# Patient Record
Sex: Female | Born: 1935
Health system: Southern US, Community
[De-identification: ages and names within clinical notes are randomized; demographics above are authoritative.]

## PROBLEM LIST (undated history)

## (undated) DIAGNOSIS — I480 Paroxysmal atrial fibrillation: Secondary | ICD-10-CM

## (undated) DIAGNOSIS — F419 Anxiety disorder, unspecified: Secondary | ICD-10-CM

## (undated) DIAGNOSIS — I10 Essential (primary) hypertension: Secondary | ICD-10-CM

## (undated) DIAGNOSIS — R079 Chest pain, unspecified: Secondary | ICD-10-CM

## (undated) DIAGNOSIS — Z5189 Encounter for other specified aftercare: Secondary | ICD-10-CM

## (undated) DIAGNOSIS — E785 Hyperlipidemia, unspecified: Secondary | ICD-10-CM

## (undated) DIAGNOSIS — H269 Unspecified cataract: Secondary | ICD-10-CM

## (undated) HISTORY — DX: Unspecified cataract: H26.9

## (undated) HISTORY — PX: CHOLECYSTECTOMY: SHX55

## (undated) HISTORY — PX: OTHER SURGICAL HISTORY: SHX169

## (undated) HISTORY — PX: EYE SURGERY: SHX253

## (undated) HISTORY — DX: Paroxysmal atrial fibrillation: I48.0

## (undated) HISTORY — DX: Hyperlipidemia, unspecified: E78.5

## (undated) HISTORY — DX: Encounter for other specified aftercare: Z51.89

## (undated) HISTORY — DX: Chest pain, unspecified: R07.9

## (undated) HISTORY — DX: Essential (primary) hypertension: I10

## (undated) HISTORY — DX: Anxiety disorder, unspecified: F41.9

---

## 2016-12-12 ENCOUNTER — Ambulatory Visit (INDEPENDENT_AMBULATORY_CARE_PROVIDER_SITE_OTHER): Payer: Medicare HMO | Admitting: Unknown Physician Specialty

## 2016-12-12 ENCOUNTER — Encounter: Payer: Self-pay | Admitting: Unknown Physician Specialty

## 2016-12-12 VITALS — BP 142/83 | HR 103 | Temp 98.7°F | Ht 59.5 in | Wt 177.8 lb

## 2016-12-12 DIAGNOSIS — T50905A Adverse effect of unspecified drugs, medicaments and biological substances, initial encounter: Secondary | ICD-10-CM | POA: Insufficient documentation

## 2016-12-12 DIAGNOSIS — E7800 Pure hypercholesterolemia, unspecified: Secondary | ICD-10-CM | POA: Diagnosis not present

## 2016-12-12 DIAGNOSIS — R9431 Abnormal electrocardiogram [ECG] [EKG]: Secondary | ICD-10-CM

## 2016-12-12 DIAGNOSIS — F5101 Primary insomnia: Secondary | ICD-10-CM | POA: Diagnosis not present

## 2016-12-12 DIAGNOSIS — R69 Illness, unspecified: Secondary | ICD-10-CM | POA: Diagnosis not present

## 2016-12-12 DIAGNOSIS — R000 Tachycardia, unspecified: Secondary | ICD-10-CM

## 2016-12-12 DIAGNOSIS — I10 Essential (primary) hypertension: Secondary | ICD-10-CM | POA: Insufficient documentation

## 2016-12-12 DIAGNOSIS — T887XXA Unspecified adverse effect of drug or medicament, initial encounter: Secondary | ICD-10-CM | POA: Diagnosis not present

## 2016-12-12 DIAGNOSIS — G4700 Insomnia, unspecified: Secondary | ICD-10-CM | POA: Insufficient documentation

## 2016-12-12 DIAGNOSIS — R079 Chest pain, unspecified: Secondary | ICD-10-CM | POA: Diagnosis not present

## 2016-12-12 MED ORDER — VERAPAMIL HCL ER 180 MG PO TBCR: 180.0000 mg | EXTENDED_RELEASE_TABLET | Freq: Every day | ORAL | 2 refills | Status: DC

## 2016-12-12 MED ORDER — ATORVASTATIN CALCIUM 20 MG PO TABS: 20.0000 mg | ORAL_TABLET | Freq: Every day | ORAL | 3 refills | Status: DC

## 2016-12-12 NOTE — Assessment & Plan Note (Signed)
Allergic to the drug store generic of Diltiazam.  I will change to Verapamil.

## 2016-12-12 NOTE — Progress Notes (Signed)
BP (!) 142/83 (BP Location: Left Arm, Cuff Size: Large)   Pulse (!) 103   Temp 98.7 F (37.1 C)   Ht 4' 11.5" (1.511 m)   Wt 177 lb 12.8 oz (80.6 kg)   SpO2 94%   BMI 35.31 kg/m    Subjective:    Patient ID: Holly Lewis, female    DOB: 1932-09-13, 83 y.o.   MRN: [id number]  HPI: Holly Fordycelma Lewis is a 83 y.o. female  Chief Complaint  Patient presents with  . Establish Care     pt states that she is not currently taking any medications but that she has been on eliquis, dilatiazem, and a cholesterol medication in the past   Hypertension States he did have Diltiazem for her BP.   Average home BPs   No problems or lightheadedness Having chest pressure that comes and goes.   No Edema  Hyperlipidemia Had been on a cholesterol medication but ran out and not sure what it was.  Had to go through several before she got the right ones.   Reactions typically include rashes   Diet compliance: Off and on eats well Exercise: walks mainly  Insomnia Life long problem  Family History  Problem Relation Age of Onset  . Diabetes Mother   . Diabetes Sister   . Cancer Sister        breast  . Alzheimer's disease Sister   . Diabetes Brother   . Cancer Brother        blood   Social History   Social History  . Marital status: Widowed    Spouse name: N/A  . Number of children: N/A  . Years of education: N/A   Occupational History  . Not on file.   Social History Main Topics  . Smoking status: Never Smoker  . Smokeless tobacco: Never Used  . Alcohol use No  . Drug use: No  . Sexual activity: No   Other Topics Concern  . Not on file   Social History Narrative  . No narrative on file   Past Medical History:  Diagnosis Date  . Anxiety   . Blood transfusion without reported diagnosis   . Cataract   . Hyperlipidemia   . Hypertension    Past Surgical History:  Procedure Laterality Date  . CHOLECYSTECTOMY    . EYE SURGERY    . 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 kidney stones      Relevant past medical, surgical, family and social history reviewed and updated as indicated. Interim medical history since our last visit reviewed. Allergies and medications reviewed and updated.  Review of Systems  HENT: Negative.   Eyes: Negative.   Respiratory: Negative.   Cardiovascular:       Chest pressure comes and goes.  Not sure if with activity or not.  No history of stress tests  Endocrine: Negative.   Genitourinary: Negative.   Musculoskeletal: Negative.   Skin: Negative.   Psychiatric/Behavioral:       Life-long problem sleeping   Per HPI unless specifically indicated above    Objective:    BP (!) 142/83 (BP Location: Left Arm, Cuff Size: Large)   Pulse (!) 103   Temp 98.7 F (37.1 C)   Ht 4' 11.5" (1.511 m)   Wt 177 lb 12.8 oz (80.6 kg)   SpO2 94%   BMI 35.31 kg/m   Wt Readings from Last 3  Encounters:  12/12/16 177 lb 12.8 oz (80.6 kg)    Physical Exam  Constitutional: She is oriented to person, place, and time. She appears well-developed and well-nourished. No distress.  HENT:  Head: Normocephalic and atraumatic.  Eyes: Conjunctivae and lids are normal. Right eye exhibits no discharge. Left eye exhibits no discharge. No scleral icterus.  Neck: Normal range of motion. Neck supple. No JVD present. Carotid bruit is not present.  Cardiovascular: Normal rate, regular rhythm and normal heart sounds.   Pulmonary/Chest: Effort normal and breath sounds normal.  Abdominal: Soft. Normal appearance. She exhibits no distension. There is no splenomegaly or hepatomegaly. There is no rebound.  Musculoskeletal: Normal range of motion.  Neurological: She is alert and oriented to person, place, and time.  Skin: Skin is warm, dry and intact. No rash noted. No pallor.  Psychiatric: She has a normal mood and affect. Her behavior is normal. Judgment and thought content normal.   EKG was NSR with changes  consistent with prior inferior and anterior infarct.  No acute changes.    No results found for this or any previous visit.    Assessment & Plan:   Problem List Items Addressed This Visit      Unprioritized   Abnormal EKG   Relevant Orders   Ambulatory referral to Cardiology   Essential hypertension, benign    Allergic to the drug store generic of Diltiazam.  I will change to Verapamil.        Relevant Medications   verapamil (CALAN-SR) 180 MG CR tablet   atorvastatin (LIPITOR) 20 MG tablet   Other Relevant Orders   Comprehensive metabolic panel   Hypercholesteremia    Restart cholesterol medication due to history of old infarcts on EKG      Relevant Medications   verapamil (CALAN-SR) 180 MG CR tablet   atorvastatin (LIPITOR) 20 MG tablet   Other Relevant Orders   Lipid Panel w/o Chol/HDL Ratio   Insomnia    States it is life-long      Medication reaction    Reaction to multiple medications with hives.  Most recently reacted to the "orange" Diltiazam but not the "blue" she got at the previous pharmacy.  Will change to Verapamil.  Also rx for statin.  Asked to start meds at a different time.        Tachycardia    Improved on EKG.  Will Verapamil      Relevant Orders   Ambulatory referral to Cardiology   TSH    Other Visit Diagnoses    Chest pain, unspecified type    -  Primary   Relevant Orders   EKG 12-Lead (Completed)   Ambulatory referral to Cardiology       Follow up plan: Return in about 4 weeks (around 01/09/2017).

## 2016-12-12 NOTE — Assessment & Plan Note (Signed)
States it is life-long

## 2016-12-12 NOTE — Assessment & Plan Note (Signed)
Improved on EKG.  Will Verapamil

## 2016-12-12 NOTE — Assessment & Plan Note (Signed)
Reaction to multiple medications with hives.  Most recently reacted to the "orange" Diltiazam but not the "blue" she got at the previous pharmacy.  Will change to Verapamil.  Also rx for statin.  Asked to start meds at a different time.

## 2016-12-12 NOTE — Assessment & Plan Note (Addendum)
Restart cholesterol medication due to history of old infarcts on EKG

## 2016-12-13 ENCOUNTER — Encounter: Payer: Self-pay | Admitting: Unknown Physician Specialty

## 2016-12-13 LAB — COMPREHENSIVE METABOLIC PANEL
A/G RATIO: 1.4 (ref 1.2–2.2)
ALK PHOS: 81 IU/L (ref 39–117)
ALT: 13 IU/L (ref 0–32)
AST: 22 IU/L (ref 0–40)
Albumin: 4.3 g/dL (ref 3.5–4.7)
BUN/Creatinine Ratio: 22 (ref 12–28)
BUN: 19 mg/dL (ref 8–27)
CO2: 23 mmol/L (ref 20–29)
Calcium: 9.3 mg/dL (ref 8.7–10.3)
Chloride: 105 mmol/L (ref 96–106)
Creatinine, Ser: 0.87 mg/dL (ref 0.57–1.00)
GFR calc Af Amer: 72 mL/min/{1.73_m2} (ref >59)
GFR calc non Af Amer: 62 mL/min/{1.73_m2} (ref >59)
GLOBULIN, TOTAL: 3.1 g/dL (ref 1.5–4.5)
Glucose: 68 mg/dL (ref 65–99)
POTASSIUM: 4.6 mmol/L (ref 3.5–5.2)
SODIUM: 141 mmol/L (ref 134–144)
Total Protein: 7.4 g/dL (ref 6.0–8.5)

## 2016-12-13 LAB — LIPID PANEL W/O CHOL/HDL RATIO
CHOLESTEROL TOTAL: 238 mg/dL — AB (ref 100–199)
HDL: 55 mg/dL (ref >39)
LDL CALC: 156 mg/dL — AB (ref 0–99)
TRIGLYCERIDES: 134 mg/dL (ref 0–149)
VLDL Cholesterol Cal: 27 mg/dL (ref 5–40)

## 2016-12-13 LAB — TSH: TSH: 3.31 u[IU]/mL (ref 0.450–4.500)

## 2017-01-09 ENCOUNTER — Encounter: Payer: Self-pay | Admitting: Unknown Physician Specialty

## 2017-01-09 ENCOUNTER — Ambulatory Visit (INDEPENDENT_AMBULATORY_CARE_PROVIDER_SITE_OTHER): Payer: Medicare HMO | Admitting: Unknown Physician Specialty

## 2017-01-09 DIAGNOSIS — N3941 Urge incontinence: Secondary | ICD-10-CM

## 2017-01-09 DIAGNOSIS — E7800 Pure hypercholesterolemia, unspecified: Secondary | ICD-10-CM

## 2017-01-09 DIAGNOSIS — L509 Urticaria, unspecified: Secondary | ICD-10-CM

## 2017-01-09 DIAGNOSIS — F419 Anxiety disorder, unspecified: Secondary | ICD-10-CM

## 2017-01-09 DIAGNOSIS — Z8679 Personal history of other diseases of the circulatory system: Secondary | ICD-10-CM

## 2017-01-09 DIAGNOSIS — R69 Illness, unspecified: Secondary | ICD-10-CM | POA: Diagnosis not present

## 2017-01-09 DIAGNOSIS — L209 Atopic dermatitis, unspecified: Secondary | ICD-10-CM | POA: Insufficient documentation

## 2017-01-09 MED ORDER — SERTRALINE HCL 50 MG PO TABS: 50.0000 mg | ORAL_TABLET | Freq: Every day | ORAL | 3 refills | Status: DC

## 2017-01-09 MED ORDER — OXYBUTYNIN CHLORIDE ER 10 MG PO TB24: 10.0000 mg | ORAL_TABLET | Freq: Every day | ORAL | 2 refills | Status: DC

## 2017-01-09 NOTE — Assessment & Plan Note (Signed)
Related to anxiety

## 2017-01-09 NOTE — Assessment & Plan Note (Signed)
Chronic.  Will start Sertraline 50mg  daily

## 2017-01-09 NOTE — Assessment & Plan Note (Signed)
Rx for Ditropan XL.  Side effects discussed

## 2017-01-09 NOTE — Assessment & Plan Note (Signed)
Normal sinus rhythm here in office and therefore will not start Eliquis.  Appointment pending with cardiology

## 2017-01-09 NOTE — Assessment & Plan Note (Signed)
Tolerating Atorvastatin 

## 2017-01-09 NOTE — Progress Notes (Signed)
BP (!) 142/76   Pulse 70   Temp 97.8 F (36.6 C)   Wt 181 lb 3.2 oz (82.2 kg)   SpO2 97%   BMI 35.99 kg/m    Subjective:    Patient ID: Holly Lewis, female    DOB: 1932/10/09, 83 y.o.   MRN: [id number]  HPI: Holly Lewis is a 83 y.o. female  Chief Complaint  Patient presents with  . Hypertension    4 week f/up  . Urinary Incontinence    pt states she has been having trouble with incontinence for a while but it has recently gotten worse   . Rash    pt states she is still having rashes on her arms everyday, states she thought it was from her diltiazem but states the rashes are still coming up after stopping the medication   Pt with multiple reactions to multiple medications.  Last visit we added Atorvastatin and Cardizem and feel well.  However, continues to have intermittent rashes.  This starts on her arms and by the end of the day it is everywhere else.  She thinks much of it has to do with concerns about incontinence.    Previous records I got yesterday were reviewed.  Tolerated cholesterol medication was Fenofibrate but tolerating Atorvastatin at this time.  Anxiety, panic, and depression were noted.    Anxiety Reviewed old records and treated with anxiety in the past with Buspar and Xanax. She had been given Lexapro but it is not clear why stopped and not mentioned in f/u office notes.   Depression screen [facility] 2/9 12/12/2016  Decreased Interest 0  Down, Depressed, Hopeless 0  PHQ - 2 Score 0  Altered sleeping 3  Tired, decreased energy 3  Change in appetite 3  Feeling bad or failure about yourself  0  Trouble concentrating 0  Moving slowly or fidgety/restless 0  Suicidal thoughts 0  PHQ-9 Score 9    Incontinence States this has been going on for some time.  She had a surgical procedure in the past which did work.  However in 2008 she was told there were no more surgical options available.  States she often can't get to the bathroom fast enough but also has  leakage.  She drinks coffee in the AM.  She has tried timed voiding (but seems not eager to be very vigilant with this)  Hypertension Using medications without difficulty Average home BPs   No problems or lightheadedness No chest pain with exertion or shortness of breath No Edema   Hyperlipidemia Using medications without problems: No Muscle aches    Relevant past medical, surgical, family and social history reviewed and updated as indicated. Interim medical history since our last visit reviewed. Allergies and medications reviewed and updated.  Review of Systems  Per HPI unless specifically indicated above     Objective:    BP (!) 142/76   Pulse 70   Temp 97.8 F (36.6 C)   Wt 181 lb 3.2 oz (82.2 kg)   SpO2 97%   BMI 35.99 kg/m   Wt Readings from Last 3 Encounters:  01/09/17 181 lb 3.2 oz (82.2 kg)  12/12/16 177 lb 12.8 oz (80.6 kg)    Physical Exam  Constitutional: She is oriented to person, place, and time. She appears well-developed and well-nourished. No distress.  HENT:  Head: Normocephalic and atraumatic.  Eyes: Conjunctivae and lids are normal. Right eye exhibits no discharge. Left eye exhibits no discharge. No scleral icterus.  Neck:  Normal range of motion. Neck supple. No JVD present. Carotid bruit is not present.  Cardiovascular: Normal rate, regular rhythm and normal heart sounds.   Pulmonary/Chest: Effort normal and breath sounds normal.  Abdominal: Normal appearance. There is no splenomegaly or hepatomegaly.  Musculoskeletal: Normal range of motion.  Neurological: She is alert and oriented to person, place, and time.  Skin: Skin is warm, dry and intact. No rash noted. No pallor.  Psychiatric: She has a normal mood and affect. Her behavior is normal. Judgment and thought content normal.    Results for orders placed or performed in visit on 12/12/16  TSH  Result Value Ref Range   TSH 3.310 0.450 - 4.500 uIU/mL  Comprehensive metabolic panel  Result  Value Ref Range   Glucose 68 65 - 99 mg/dL   BUN 19 8 - 27 mg/dL   Creatinine, Ser 1.610.87 0.57 - 1.00 mg/dL   GFR calc non Af Amer 62 >59 mL/min/1.73   GFR calc Af Amer 72 >59 mL/min/1.73   BUN/Creatinine Ratio 22 12 - 28   Sodium 141 134 - 144 mmol/L   Potassium 4.6 3.5 - 5.2 mmol/L   Chloride 105 96 - 106 mmol/L   CO2 23 20 - 29 mmol/L   Calcium 9.3 8.7 - 10.3 mg/dL   Total Protein 7.4 6.0 - 8.5 g/dL   Albumin 4.3 3.5 - 4.7 g/dL   Globulin, Total 3.1 1.5 - 4.5 g/dL   Albumin/Globulin Ratio 1.4 1.2 - 2.2   Bilirubin Total <0.2 0.0 - 1.2 mg/dL   Alkaline Phosphatase 81 39 - 117 IU/L   AST 22 0 - 40 IU/L   ALT 13 0 - 32 IU/L  Lipid Panel w/o Chol/HDL Ratio  Result Value Ref Range   Cholesterol, Total 238 (H) 100 - 199 mg/dL   Triglycerides 096134 0 - 149 mg/dL   HDL 55 >04>39 mg/dL   VLDL Cholesterol Cal 27 5 - 40 mg/dL   LDL Calculated 540156 (H) 0 - 99 mg/dL      Assessment & Plan:   Problem List Items Addressed This Visit      Unprioritized   Anxiety    Chronic.  Will start Sertraline 50mg  daily      Relevant Medications   sertraline (ZOLOFT) 50 MG tablet   History of atrial fibrillation    Normal sinus rhythm here in office and therefore will not start Eliquis.  Appointment pending with cardiology         Hypercholesteremia    Tolerating Atorvastatin      Urge incontinence of urine    Rx for Ditropan XL.  Side effects discussed      Relevant Medications   oxybutynin (DITROPAN-XL) 10 MG 24 hr tablet   Urticaria    Related to anxiety          Follow up plan: Return in about 4 weeks (around 02/06/2017).

## 2017-02-09 ENCOUNTER — Ambulatory Visit (INDEPENDENT_AMBULATORY_CARE_PROVIDER_SITE_OTHER): Payer: Medicare HMO | Admitting: Unknown Physician Specialty

## 2017-02-09 ENCOUNTER — Encounter: Payer: Self-pay | Admitting: Unknown Physician Specialty

## 2017-02-09 DIAGNOSIS — I10 Essential (primary) hypertension: Secondary | ICD-10-CM

## 2017-02-09 DIAGNOSIS — N3941 Urge incontinence: Secondary | ICD-10-CM

## 2017-02-09 DIAGNOSIS — Z8679 Personal history of other diseases of the circulatory system: Secondary | ICD-10-CM

## 2017-02-09 DIAGNOSIS — F419 Anxiety disorder, unspecified: Secondary | ICD-10-CM | POA: Diagnosis not present

## 2017-02-09 DIAGNOSIS — R69 Illness, unspecified: Secondary | ICD-10-CM | POA: Diagnosis not present

## 2017-02-09 NOTE — Assessment & Plan Note (Signed)
Stable, continue present medications.   

## 2017-02-09 NOTE — Progress Notes (Signed)
BP 131/83 (BP Location: Left Arm, Cuff Size: Large)   Pulse 78   Temp 97.6 F (36.4 C)   Wt 179 lb (81.2 kg)   SpO2 94%   BMI 35.55 kg/m    Subjective:    Patient ID: Holly Lewis, female    DOB: 07/12/1932, 83 y.o.   MRN: [id number]  HPI: Holly Lewis is a 83 y.o. female  Chief Complaint  Patient presents with  . Anxiety    4 week f/up  . Bladder Incontinence    4 week f/up   Pt states she is doing much better.  She states she feels "fantastic" and a weight has been taking off her shoulders.  She attributes this due to urinary incontinence improved.    Rashes Improved.    Anxiety Doing much better with more energy and going all the time Depression screen [facility] 2/9 02/09/2017 12/12/2016  Decreased Interest 0 0  Down, Depressed, Hopeless 0 0  PHQ - 2 Score 0 0  Altered sleeping 2 3  Tired, decreased energy 0 3  Change in appetite 0 3  Feeling bad or failure about yourself  0 0  Trouble concentrating 0 0  Moving slowly or fidgety/restless 0 0  Suicidal thoughts 0 0  PHQ-9 Score 2 9     A-fib History of A-fib.  Appt pending with cardiology.  Stopped Eliquis due to no signs in office but probably needs a holter for further evaluation   Relevant past medical, surgical, family and social history reviewed and updated as indicated. Interim medical history since our last visit reviewed. Allergies and medications reviewed and updated.  Review of Systems  Per HPI unless specifically indicated above     Objective:    BP 131/83 (BP Location: Left Arm, Cuff Size: Large)   Pulse 78   Temp 97.6 F (36.4 C)   Wt 179 lb (81.2 kg)   SpO2 94%   BMI 35.55 kg/m   Wt Readings from Last 3 Encounters:  02/09/17 179 lb (81.2 kg)  01/09/17 181 lb 3.2 oz (82.2 kg)  12/12/16 177 lb 12.8 oz (80.6 kg)    Physical Exam  Constitutional: She is oriented to person, place, and time. She appears well-developed and well-nourished. No distress.  HENT:  Head: Normocephalic and  atraumatic.  Eyes: Conjunctivae and lids are normal. Right eye exhibits no discharge. Left eye exhibits no discharge. No scleral icterus.  Neck: Normal range of motion. Neck supple. No JVD present. Carotid bruit is not present.  Cardiovascular: Normal rate, regular rhythm and normal heart sounds.   Pulmonary/Chest: Effort normal and breath sounds normal.  Abdominal: Normal appearance. There is no splenomegaly or hepatomegaly.  Musculoskeletal: Normal range of motion.  Neurological: She is alert and oriented to person, place, and time.  Skin: Skin is warm, dry and intact. No rash noted. No pallor.  Psychiatric: She has a normal mood and affect. Her behavior is normal. Judgment and thought content normal.    Results for orders placed or performed in visit on 12/12/16  TSH  Result Value Ref Range   TSH 3.310 0.450 - 4.500 uIU/mL  Comprehensive metabolic panel  Result Value Ref Range   Glucose 68 65 - 99 mg/dL   BUN 19 8 - 27 mg/dL   Creatinine, Ser 4.09 0.57 - 1.00 mg/dL   GFR calc non Af Amer 62 >59 mL/min/1.73   GFR calc Af Amer 72 >59 mL/min/1.73   BUN/Creatinine Ratio 22 12 - 28  Sodium 141 134 - 144 mmol/L   Potassium 4.6 3.5 - 5.2 mmol/L   Chloride 105 96 - 106 mmol/L   CO2 23 20 - 29 mmol/L   Calcium 9.3 8.7 - 10.3 mg/dL   Total Protein 7.4 6.0 - 8.5 g/dL   Albumin 4.3 3.5 - 4.7 g/dL   Globulin, Total 3.1 1.5 - 4.5 g/dL   Albumin/Globulin Ratio 1.4 1.2 - 2.2   Bilirubin Total <0.2 0.0 - 1.2 mg/dL   Alkaline Phosphatase 81 39 - 117 IU/L   AST 22 0 - 40 IU/L   ALT 13 0 - 32 IU/L  Lipid Panel w/o Chol/HDL Ratio  Result Value Ref Range   Cholesterol, Total 238 (H) 100 - 199 mg/dL   Triglycerides 414 0 - 149 mg/dL   HDL 55 >23 mg/dL   VLDL Cholesterol Cal 27 5 - 40 mg/dL   LDL Calculated 953 (H) 0 - 99 mg/dL      Assessment & Plan:   Problem List Items Addressed This Visit      Unprioritized   Anxiety    Improved.  Continue Sertraline      Essential  hypertension, benign    Stable, continue present medications.        History of atrial fibrillation    Cardiology appt pending      Urge incontinence of urine    Much improved with Ditropan.  Continue present          Follow up plan: Return for schedule wellness and appt with me in 3 months.

## 2017-02-09 NOTE — Assessment & Plan Note (Signed)
Improved.  Continue Sertraline

## 2017-02-09 NOTE — Assessment & Plan Note (Signed)
Cardiology appt pending

## 2017-02-09 NOTE — Assessment & Plan Note (Signed)
Much improved with Ditropan.  Continue present

## 2017-03-05 ENCOUNTER — Other Ambulatory Visit: Payer: Self-pay | Admitting: Unknown Physician Specialty

## 2017-03-14 ENCOUNTER — Ambulatory Visit: Payer: Self-pay | Admitting: Internal Medicine

## 2017-03-31 ENCOUNTER — Other Ambulatory Visit: Payer: Self-pay | Admitting: Unknown Physician Specialty

## 2017-04-01 ENCOUNTER — Other Ambulatory Visit: Payer: Self-pay | Admitting: Unknown Physician Specialty

## 2017-04-02 DIAGNOSIS — R69 Illness, unspecified: Secondary | ICD-10-CM | POA: Diagnosis not present

## 2017-04-19 ENCOUNTER — Ambulatory Visit (INDEPENDENT_AMBULATORY_CARE_PROVIDER_SITE_OTHER): Payer: Medicare HMO | Admitting: Cardiovascular Disease

## 2017-04-19 ENCOUNTER — Encounter: Payer: Self-pay | Admitting: Cardiovascular Disease

## 2017-04-19 VITALS — BP 130/80 | HR 77 | Ht 59.0 in | Wt 177.2 lb

## 2017-04-19 DIAGNOSIS — I480 Paroxysmal atrial fibrillation: Secondary | ICD-10-CM | POA: Diagnosis not present

## 2017-04-19 DIAGNOSIS — R079 Chest pain, unspecified: Secondary | ICD-10-CM

## 2017-04-19 DIAGNOSIS — I10 Essential (primary) hypertension: Secondary | ICD-10-CM | POA: Diagnosis not present

## 2017-04-19 MED ORDER — APIXABAN 5 MG PO TABS: 5.0000 mg | ORAL_TABLET | Freq: Two times a day (BID) | ORAL | 5 refills | Status: DC

## 2017-04-19 NOTE — Progress Notes (Signed)
Cardiology Office Note   Date:  04/19/2017   ID:  Holly Lewis, DOB 07/18/1932, MRN [id number]  PCP:  Gabriel CirriWicker, Cheryl, NP  Cardiologist:   Lorine BearsMuhammad Leinani Lisbon, MD   Chief Complaint  Patient presents with  . other    Ref by Lorenza Evangelistherly Wicker, NP for an abnormal EKG. Pt. c/o mid-sternum chest pain and difficulty breathing. Meds reviewed by the pt. verbally.        History of Present Illness: Holly Lewis is a 83 y.o. female who was referred by Gabriel Cirriheryl Wicker for evaluation of paroxysmal atrial fibrillation. I do not have her previous cardiac records but according to the patient she was diagnosed with atrial fibrillation about 2 years ago when [place].  From her description, she underwent TEE followed by cardioversion.  She was placed on Eliquis and had no recurrent clinical atrial fibrillation since then.  She did not have any bleeding complications with Eliquis but she could not get the medication about 1 year ago she stopped taking it. She has known history of hypertension hyperlipidemia.  She is not a smoker and has no family history of premature coronary artery disease although her father died suddenly at the age of 81. She reports no palpitations or shortness of breath.  However, she complains of intermittent chest tightness which started about 2-3 months ago.  The pain is substernal described as heaviness with no radiation.  It happens at rest and does not worsen with physical activities.  Past Medical History:  Diagnosis Date  . A-fib (HCC)   . Anxiety   . Blood transfusion without reported diagnosis   . Cataract   . Hyperlipidemia   . Hypertension     Past Surgical History:  Procedure Laterality Date  . CHOLECYSTECTOMY    . EYE SURGERY    . 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 kidney stones       Current Outpatient Prescriptions  Medication Sig Dispense Refill  . atorvastatin (LIPITOR) 20 MG tablet Take 1 tablet  (20 mg total) by mouth daily. 90 tablet 3  . oxybutynin (DITROPAN-XL) 10 MG 24 hr tablet TAKE 1 TABLET BY MOUTH EVERYDAY AT BEDTIME 30 tablet 2  . sertraline (ZOLOFT) 50 MG tablet Take 1 tablet (50 mg total) by mouth daily. 30 tablet 3  . verapamil (CALAN-SR) 180 MG CR tablet TAKE 1 TABLET (180 MG TOTAL) BY MOUTH AT BEDTIME. 30 tablet 2  . apixaban (ELIQUIS) 5 MG TABS tablet Take 1 tablet (5 mg total) by mouth 2 (two) times daily. 60 tablet 5   No current facility-administered medications for this visit.     Allergies:   Diltiazem    Social History:  The patient  reports that she has never smoked. She has never used smokeless tobacco. She reports that she does not drink alcohol or use drugs.   Family History:  The patient's family history includes Alzheimer's disease in her sister; Cancer in her brother and sister; Diabetes in her brother, mother, and sister.    ROS:  Please see the history of present illness.   Otherwise, review of systems are positive for none.   All other systems are reviewed and negative.    PHYSICAL EXAM: VS:  BP 130/80 (BP Location: Right Arm, Patient Position: Sitting, Cuff Size: Normal)   Pulse 77   Ht 4\' 11"  (1.499 m)   Wt 177 lb 4 oz (80.4  kg)   BMI 35.80 kg/m  , BMI Body mass index is 35.8 kg/m. GEN: Well nourished, well developed, in no acute distress  HEENT: normal  Neck: no JVD, carotid bruits, or masses Cardiac: RRR; no murmurs, rubs, or gallops,no edema  Respiratory:  clear to auscultation bilaterally, normal work of breathing GI: soft, nontender, nondistended, + BS MS: no deformity or atrophy  Skin: warm and dry, no rash Neuro:  Strength and sensation are intact Psych: euthymic mood, full affect   EKG:  EKG is ordered today. The ekg ordered today demonstrates normal sinus rhythm with possible old inferior infarct.   Recent Labs: 12/12/2016: ALT 13; BUN 19; Creatinine, Ser 0.87; Potassium 4.6; Sodium 141; TSH 3.310    Lipid Panel      Component Value Date/Time   CHOL 238 (H) 12/12/2016 1358   TRIG 134 12/12/2016 1358   HDL 55 12/12/2016 1358   LDLCALC 156 (H) 12/12/2016 1358      Wt Readings from Last 3 Encounters:  04/19/17 177 lb 4 oz (80.4 kg)  02/09/17 179 lb (81.2 kg)  01/09/17 181 lb 3.2 oz (82.2 kg)       PAD Screen 04/19/2017  Previous PAD dx? No  Previous surgical procedure? No  Pain with walking? No  Feet/toe relief with dangling? No  Painful, non-healing ulcers? No  Extremities discolored? No      ASSESSMENT AND PLAN:  1.  Paroxysmal atrial fibrillation: She is currently in normal sinus rhythm.  We are going to request her previous cardiac records but it appears from her description that she had TEE followed by cardioversion about 2 years ago. CHADS VASc score is 4.  Thus, I recommend long-term anticoagulation.  She tolerated Eliquis in the past without complications.  I resumed Eliquis at 5 mg twice daily  2.  Atypical chest pain: The chest pain is overall atypical but her EKG is abnormal and suggestive of prior inferior infarct.  I requested a pharmacologic nuclear stress test.  She is not able to exercise on a treadmill.  3.  Essential hypertension: Blood pressure is controlled on verapamil.    Disposition:   FU with me in 3 months  Signed,  Lorine Bears, MD  04/19/2017 11:27 AM    [facility]

## 2017-04-19 NOTE — Patient Instructions (Addendum)
Medication Instructions:  Your physician has recommended you make the following change in your medication:  START taking eliquis 5mg  twice daily   Labwork: none  Testing/Procedures: Your physician has requested that you have a lexiscan myoview. For further information please visit [email or web address]. Please follow instruction sheet, as given.  [facility] MYOVIEW  Your caregiver has ordered a Stress Test with nuclear imaging. The purpose of this test is to evaluate the blood supply to your heart muscle. This procedure is referred to as a "Non-Invasive Stress Test." This is because other than having an IV started in your vein, nothing is inserted or "invades" your body. Cardiac stress tests are done to find areas of poor blood flow to the heart by determining the extent of coronary artery disease (CAD). Some patients exercise on a treadmill, which naturally increases the blood flow to your heart, while others who are  unable to walk on a treadmill due to physical limitations have a pharmacologic/chemical stress agent called Lexiscan . This medicine will mimic walking on a treadmill by temporarily increasing your coronary blood flow.   Please note: these test may take anywhere between 2-4 hours to complete  [facility]  THE VOLUNTEERS AT THE FIRST DESK WILL DIRECT YOU WHERE TO GO  Date of Procedure:_____________________________________  Arrival Time for Procedure:______________________________  Instructions regarding medication:   You may take all of your morning medications with a sip of water.  Please call when you are ready to schedule your test.  PLEASE NOTIFY THE OFFICE AT LEAST 24 HOURS IN ADVANCE IF YOU ARE UNABLE TO KEEP YOUR APPOINTMENT.  [phone] AND  PLEASE NOTIFY NUCLEAR MEDICINE AT [facility] AT LEAST 24 HOURS IN ADVANCE IF YOU ARE UNABLE TO KEEP YOUR APPOINTMENT. [phone]  How to prepare for your Myoview test:  1. Do not eat or drink after  midnight 2. No caffeine for 24 hours prior to test 3. No smoking 24 hours prior to test. 4. Your medication may be taken with water.  If your doctor stopped a medication because of this test, do not take that medication. 5. Ladies, please do not wear dresses.  Skirts or pants are appropriate. Please wear a short sleeve shirt. 6. No perfume, cologne or lotion. 7. Wear comfortable walking shoes. No heels!            Follow-Up: Your physician recommends that you schedule a follow-up appointment in: 3 months with Dr. Kirke CorinArida.    Any Other Special Instructions Will Be Listed Below (If Applicable).     If you need a refill on your cardiac medications before your next appointment, please call your pharmacy.  Cardiac Nuclear Scan A cardiac nuclear scan is a test that measures blood flow to the heart when a person is resting and when he or she is exercising. The test looks for problems such as:  Not enough blood reaching a portion of the heart.  The heart muscle not working normally.  You may need this test if:  You have heart disease.  You have had abnormal lab results.  You have had heart surgery or angioplasty.  You have chest pain.  You have shortness of breath.  In this test, a radioactive dye (tracer) is injected into your bloodstream. After the tracer has traveled to your heart, an imaging device is used to measure how much of the tracer is absorbed by or distributed to various areas of your heart. This procedure is usually done at a hospital and  takes 2-4 hours. Tell a health care provider about:  Any allergies you have.  All medicines you are taking, including vitamins, herbs, eye drops, creams, and over-the-counter medicines.  Any problems you or family members have had with the use of anesthetic medicines.  Any blood disorders you have.  Any surgeries you have had.  Any medical conditions you have.  Whether you are pregnant or may be pregnant. What are the  risks? Generally, this is a safe procedure. However, problems may occur, including:  Serious chest pain and heart attack. This is only a risk if the stress portion of the test is done.  Rapid heartbeat.  Sensation of warmth in your chest. This usually passes quickly.  What happens before the procedure?  Ask your health care provider about changing or stopping your regular medicines. This is especially important if you are taking diabetes medicines or blood thinners.  Remove your jewelry on the day of the procedure. What happens during the procedure?  An IV tube will be inserted into one of your veins.  Your health care provider will inject a small amount of radioactive tracer through the tube.  You will wait for 20-40 minutes while the tracer travels through your bloodstream.  Your heart activity will be monitored with an electrocardiogram (ECG).  You will lie down on an exam table.  Images of your heart will be taken for about 15-20 minutes.  You may be asked to exercise on a treadmill or stationary bike. While you exercise, your heart's activity will be monitored with an ECG, and your blood pressure will be checked. If you are unable to exercise, you may be given a medicine to increase blood flow to parts of your heart.  When blood flow to your heart has peaked, a tracer will again be injected through the IV tube.  After 20-40 minutes, you will get back on the exam table and have more images taken of your heart.  When the procedure is over, your IV tube will be removed. The procedure may vary among health care providers and hospitals. Depending on the type of tracer used, scans may need to be repeated 3-4 hours later. What happens after the procedure?  Unless your health care provider tells you otherwise, you may return to your normal schedule, including diet, activities, and medicines.  Unless your health care provider tells you otherwise, you may increase your fluid  intake. This will help flush the contrast dye from your body. Drink enough fluid to keep your urine clear or pale yellow.  It is up to you to get your test results. Ask your health care provider, or the department that is doing the test, when your results will be ready. Summary  A cardiac nuclear scan measures the blood flow to the heart when a person is resting and when he or she is exercising.  You may need this test if you are at risk for heart disease.  Tell your health care provider if you are pregnant.  Unless your health care provider tells you otherwise, increase your fluid intake. This will help flush the contrast dye from your body. Drink enough fluid to keep your urine clear or pale yellow. This information is not intended to replace advice given to you by your health care provider. Make sure you discuss any questions you have with your health care provider. Document Released: 06/30/2004 Document Revised: 06/07/2016 Document Reviewed: 05/14/2013 Elsevier Interactive Patient Education  2017 [employer].

## 2017-04-26 DIAGNOSIS — E789 Disorder of lipoprotein metabolism, unspecified: Secondary | ICD-10-CM | POA: Diagnosis not present

## 2017-04-26 DIAGNOSIS — Z0000 Encounter for general adult medical examination without abnormal findings: Secondary | ICD-10-CM | POA: Diagnosis not present

## 2017-04-26 DIAGNOSIS — Z6833 Body mass index (BMI) 33.0-33.9, adult: Secondary | ICD-10-CM | POA: Diagnosis not present

## 2017-04-26 DIAGNOSIS — E669 Obesity, unspecified: Secondary | ICD-10-CM | POA: Diagnosis not present

## 2017-04-26 DIAGNOSIS — Z79899 Other long term (current) drug therapy: Secondary | ICD-10-CM | POA: Diagnosis not present

## 2017-04-26 DIAGNOSIS — Z7901 Long term (current) use of anticoagulants: Secondary | ICD-10-CM | POA: Diagnosis not present

## 2017-04-26 DIAGNOSIS — I10 Essential (primary) hypertension: Secondary | ICD-10-CM | POA: Diagnosis not present

## 2017-04-26 DIAGNOSIS — R69 Illness, unspecified: Secondary | ICD-10-CM | POA: Diagnosis not present

## 2017-04-26 DIAGNOSIS — N329 Bladder disorder, unspecified: Secondary | ICD-10-CM | POA: Diagnosis not present

## 2017-04-28 ENCOUNTER — Other Ambulatory Visit: Payer: Self-pay | Admitting: Unknown Physician Specialty

## 2017-05-15 ENCOUNTER — Encounter: Payer: Self-pay | Admitting: Unknown Physician Specialty

## 2017-05-15 ENCOUNTER — Ambulatory Visit (INDEPENDENT_AMBULATORY_CARE_PROVIDER_SITE_OTHER): Payer: Medicare HMO | Admitting: Unknown Physician Specialty

## 2017-05-15 DIAGNOSIS — F419 Anxiety disorder, unspecified: Secondary | ICD-10-CM | POA: Diagnosis not present

## 2017-05-15 DIAGNOSIS — L509 Urticaria, unspecified: Secondary | ICD-10-CM | POA: Diagnosis not present

## 2017-05-15 DIAGNOSIS — E7800 Pure hypercholesterolemia, unspecified: Secondary | ICD-10-CM

## 2017-05-15 DIAGNOSIS — I10 Essential (primary) hypertension: Secondary | ICD-10-CM | POA: Diagnosis not present

## 2017-05-15 DIAGNOSIS — R69 Illness, unspecified: Secondary | ICD-10-CM | POA: Diagnosis not present

## 2017-05-15 MED ORDER — BETAMETHASONE DIPROPIONATE AUG 0.05 % EX CREA: TOPICAL_CREAM | Freq: Two times a day (BID) | CUTANEOUS | 0 refills | Status: DC

## 2017-05-15 MED ORDER — HYDROXYZINE HCL 10 MG PO TABS: 10.0000 mg | ORAL_TABLET | Freq: Three times a day (TID) | ORAL | 0 refills | Status: DC | PRN

## 2017-05-15 MED ORDER — VERAPAMIL HCL ER 180 MG PO TBCR: 180.0000 mg | EXTENDED_RELEASE_TABLET | Freq: Every day | ORAL | 2 refills | Status: DC

## 2017-05-15 NOTE — Assessment & Plan Note (Addendum)
Worsening depending on the time.  rx for prescription steroid cream.  Check alpha gal allergy.  Hydroxyzine prn

## 2017-05-15 NOTE — Assessment & Plan Note (Signed)
Stable, continue present medications.   

## 2017-05-15 NOTE — Progress Notes (Signed)
BP 138/84 (BP Location: Left Arm, Cuff Size: Large)   Pulse 78   Temp 98.2 F (36.8 C) (Oral)   Wt 178 lb 12.8 oz (81.1 kg)   SpO2 95%   BMI 36.11 kg/m    Subjective:    Patient ID: Holly Lewis, female    DOB: Dec 25, 1932, 83 y.o.   MRN: [id number]  HPI: Holly Lewis is a 83 y.o. female  Chief Complaint  Patient presents with  . Anxiety  . Diabetes  . Hyperlipidemia  . Hypertension   Hypertension Using medications without difficulty Average home BPs This varies.  A little high today   No problems or lightheadedness No chest pain with exertion or shortness of breath No Edema  Hyperlipidemia Using medications without problems No Muscle aches  Diet compliance:Exercise: Tries to eat well.    Rash Still with urticaria.  Thinks if might be related to food.  The rash comes and goes.  She is going totry a food elimination diet.  Witch hazel and OTC cortisone helps   Relevant past medical, surgical, family and social history reviewed and updated as indicated. Interim medical history since our last visit reviewed. Allergies and medications reviewed and updated.  Review of Systems  Per HPI unless specifically indicated above     Objective:    BP 138/84 (BP Location: Left Arm, Cuff Size: Large)   Pulse 78   Temp 98.2 F (36.8 C) (Oral)   Wt 178 lb 12.8 oz (81.1 kg)   SpO2 95%   BMI 36.11 kg/m   Wt Readings from Last 3 Encounters:  05/15/17 178 lb 12.8 oz (81.1 kg)  04/19/17 177 lb 4 oz (80.4 kg)  02/09/17 179 lb (81.2 kg)    Physical Exam  Constitutional: She is oriented to person, place, and time. She appears well-developed and well-nourished. No distress.  HENT:  Head: Normocephalic and atraumatic.  Eyes: Conjunctivae and lids are normal. Right eye exhibits no discharge. Left eye exhibits no discharge. No scleral icterus.  Neck: Normal range of motion. Neck supple. No JVD present. Carotid bruit is not present.  Cardiovascular: Normal rate, regular  rhythm and normal heart sounds.  Pulmonary/Chest: Effort normal and breath sounds normal.  Abdominal: Normal appearance. There is no splenomegaly or hepatomegaly.  Musculoskeletal: Normal range of motion.  Neurological: She is alert and oriented to person, place, and time.  Skin: Skin is warm, dry and intact. No pallor.  Erythema right arm  Psychiatric: She has a normal mood and affect. Her behavior is normal. Judgment and thought content normal.    Results for orders placed or performed in visit on 12/12/16  TSH  Result Value Ref Range   TSH 3.310 0.450 - 4.500 uIU/mL  Comprehensive metabolic panel  Result Value Ref Range   Glucose 68 65 - 99 mg/dL   BUN 19 8 - 27 mg/dL   Creatinine, Ser 4.090.87 0.57 - 1.00 mg/dL   GFR calc non Af Amer 62 >59 mL/min/1.73   GFR calc Af Amer 72 >59 mL/min/1.73   BUN/Creatinine Ratio 22 12 - 28   Sodium 141 134 - 144 mmol/L   Potassium 4.6 3.5 - 5.2 mmol/L   Chloride 105 96 - 106 mmol/L   CO2 23 20 - 29 mmol/L   Calcium 9.3 8.7 - 10.3 mg/dL   Total Protein 7.4 6.0 - 8.5 g/dL   Albumin 4.3 3.5 - 4.7 g/dL   Globulin, Total 3.1 1.5 - 4.5 g/dL   Albumin/Globulin Ratio 1.4 1.2 -  2.2   Bilirubin Total <0.2 0.0 - 1.2 mg/dL   Alkaline Phosphatase 81 39 - 117 IU/L   AST 22 0 - 40 IU/L   ALT 13 0 - 32 IU/L  Lipid Panel w/o Chol/HDL Ratio  Result Value Ref Range   Cholesterol, Total 238 (H) 100 - 199 mg/dL   Triglycerides 161134 0 - 149 mg/dL   HDL 55 >09>39 mg/dL   VLDL Cholesterol Cal 27 5 - 40 mg/dL   LDL Calculated 604156 (H) 0 - 99 mg/dL      Assessment & Plan:   Problem List Items Addressed This Visit      Unprioritized   Anxiety    Improved. Used to take Alprazolam but explained it is not recommended      Relevant Medications   hydrOXYzine (ATARAX/VISTARIL) 10 MG tablet   Essential hypertension, benign    Stable, continue present medications.        Relevant Medications   verapamil (CALAN-SR) 180 MG CR tablet   Other Relevant Orders    Comprehensive metabolic panel   Hypercholesteremia    Stable, continue present medications.        Relevant Medications   verapamil (CALAN-SR) 180 MG CR tablet   Other Relevant Orders   Lipid Panel w/o Chol/HDL Ratio   Urticaria    Worsening depending on the time.  rx for prescription steroid cream.  Check alpha gal allergy.  Hydroxyzine prn      Relevant Orders   Alpha Gal IgE       Follow up plan: Return for Needs wellness exam and visit with me in 6 months.

## 2017-05-15 NOTE — Assessment & Plan Note (Addendum)
Improved. Used to take Alprazolam but explained it is not recommended

## 2017-05-16 ENCOUNTER — Encounter: Payer: Self-pay | Admitting: Unknown Physician Specialty

## 2017-05-18 LAB — COMPREHENSIVE METABOLIC PANEL
A/G RATIO: 1.4 (ref 1.2–2.2)
ALBUMIN: 4.3 g/dL (ref 3.5–4.7)
ALT: 16 IU/L (ref 0–32)
AST: 23 IU/L (ref 0–40)
Alkaline Phosphatase: 83 IU/L (ref 39–117)
BILIRUBIN TOTAL: 0.3 mg/dL (ref 0.0–1.2)
BUN / CREAT RATIO: 19 (ref 12–28)
BUN: 14 mg/dL (ref 8–27)
CHLORIDE: 104 mmol/L (ref 96–106)
CO2: 21 mmol/L (ref 20–29)
Calcium: 9.3 mg/dL (ref 8.7–10.3)
Creatinine, Ser: 0.74 mg/dL (ref 0.57–1.00)
GFR, EST AFRICAN AMERICAN: 87 mL/min/{1.73_m2} (ref >59)
GFR, EST NON AFRICAN AMERICAN: 76 mL/min/{1.73_m2} (ref >59)
Globulin, Total: 3.1 g/dL (ref 1.5–4.5)
Glucose: 84 mg/dL (ref 65–99)
POTASSIUM: 4.6 mmol/L (ref 3.5–5.2)
Sodium: 138 mmol/L (ref 134–144)
TOTAL PROTEIN: 7.4 g/dL (ref 6.0–8.5)

## 2017-05-18 LAB — LIPID PANEL W/O CHOL/HDL RATIO
Cholesterol, Total: 199 mg/dL (ref 100–199)
HDL: 58 mg/dL (ref >39)
LDL Calculated: 121 mg/dL — ABNORMAL HIGH (ref 0–99)
Triglycerides: 101 mg/dL (ref 0–149)
VLDL CHOLESTEROL CAL: 20 mg/dL (ref 5–40)

## 2017-05-18 LAB — ALPHA GAL IGE

## 2017-05-31 ENCOUNTER — Ambulatory Visit: Payer: Medicare HMO

## 2017-06-01 ENCOUNTER — Ambulatory Visit: Payer: Medicare HMO

## 2017-06-06 ENCOUNTER — Telehealth: Payer: Self-pay | Admitting: Cardiovascular Disease

## 2017-06-06 NOTE — Telephone Encounter (Signed)
Patient has an order for nm study in [facility] never scheduled and will be coming in Feb 2019 for a fu

## 2017-06-06 NOTE — Telephone Encounter (Signed)
Jasmine DecemberSharon, do you know what was going on with this?  There was never a date filled out on her AVS for her stress test, but I don't see anything after that in her chart.

## 2017-06-07 NOTE — Telephone Encounter (Signed)
Pt had November OV with Dr. Kirke CorinArida. Lexi myoview ordered. Pt did not want to schedule at that time. Has not called back. I tried to contact the patient. No answer, VM is full. Will call again.

## 2017-06-08 NOTE — Telephone Encounter (Signed)
Attempted to call patient to r/s appt 2/8 with Arida (out of office )   No ans no vm

## 2017-06-08 NOTE — Telephone Encounter (Signed)
Attempted to reach pt. No answer, no VM.

## 2017-06-13 NOTE — Telephone Encounter (Signed)
Called pt to schedule lexi myoview from November 2018 OV. No answer, VM full. Letter mailed to home address.

## 2017-06-21 ENCOUNTER — Telehealth: Payer: Self-pay

## 2017-06-21 NOTE — Telephone Encounter (Signed)
Lexi myoview for atypical chest pain and abnormal EKG ordered November 2018. Pt did not schedule this at time of OV. Pt called back to report chest pain has resolved. She also mentioned she had a normal stress test in 2016 and insurance will only pay for this once q5years. Due to these two factors, she declines lexi myoview at this time. She also prefers to follow up on an as-needed basis instead of February as suggested. Advised pt to continue to monitor sx and call if chest pain returns. Pt verbalized understanding.  February appt and test order cancelled

## 2017-06-21 NOTE — Telephone Encounter (Signed)
Cannot reach patient to r/s AWV. Cancellation possibly due to transportation. Will mail letter.

## 2017-06-21 NOTE — Telephone Encounter (Signed)
Patient calling back to schedule lexi please call

## 2017-06-21 NOTE — Telephone Encounter (Signed)
Returned call to patient. No answer. VM is full.

## 2017-06-23 ENCOUNTER — Other Ambulatory Visit: Payer: Self-pay | Admitting: Unknown Physician Specialty

## 2017-06-27 ENCOUNTER — Other Ambulatory Visit: Payer: Self-pay | Admitting: Unknown Physician Specialty

## 2017-07-25 ENCOUNTER — Ambulatory Visit (INDEPENDENT_AMBULATORY_CARE_PROVIDER_SITE_OTHER): Payer: Medicare HMO

## 2017-07-25 VITALS — BP 140/78 | HR 72 | Temp 97.9°F | Resp 16 | Ht 62.0 in | Wt 180.6 lb

## 2017-07-25 DIAGNOSIS — Z0000 Encounter for general adult medical examination without abnormal findings: Secondary | ICD-10-CM | POA: Diagnosis not present

## 2017-07-25 NOTE — Progress Notes (Signed)
Subjective:   Holly Lewis is a 84 y.o. female who presents for Medicare Annual (Subsequent) preventive examination.  Review of Systems:   Cardiac Risk Factors include: hypertension;advanced age (>56men, >26 women);dyslipidemia;obesity (BMI >30kg/m2)     Objective:     Vitals: BP 140/78 (BP Location: Left Arm, Patient Position: Sitting)   Pulse 72   Temp 97.9 F (36.6 C) (Temporal)   Resp 16   Ht 5\' 2"  (1.575 m)   Wt 180 lb 9.6 oz (81.9 kg)   BMI 33.03 kg/m   Body mass index is 33.03 kg/m.  Advanced Directives 07/25/2017  Does Patient Have a Medical Advance Directive? Yes  Type of Estate agent of [place];Living will  Copy of Healthcare Power of Attorney in Chart? Yes    Tobacco Social History   Tobacco Use  Smoking Status Never Smoker  Smokeless Tobacco Never Used     Counseling given: Not Answered   Clinical Intake:  Pre-visit preparation completed: Yes  Pain : No/denies pain     Nutritional Status: BMI > 30  Obese Nutritional Risks: None Diabetes: No  How often do you need to have someone help you when you read instructions, pamphlets, or other written materials from your doctor or pharmacy?: 1 - Never What is the last grade level you completed in school?: bachelors degree  Interpreter Needed?: No  Information entered by :: Holly Hill,LPN   Past Medical History:  Diagnosis Date  . A-fib (HCC)   . Anxiety   . Blood transfusion without reported diagnosis   . Cataract   . Hyperlipidemia   . Hypertension    Past Surgical History:  Procedure Laterality Date  . CHOLECYSTECTOMY    . EYE SURGERY    . 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 kidney stones     Family History  Problem Relation Age of Onset  . Diabetes Mother   . Diabetes Sister   . Cancer Sister        breast  . Alzheimer's disease Sister   . Diabetes Brother   . Cancer Brother        blood   Social History     Socioeconomic History  . Marital status: Widowed    Spouse name: None  . Number of children: None  . Years of education: None  . Highest education level: Bachelor's degree (e.g., BA, AB, BS)  Social Needs  . Financial resource strain: Not very hard  . Food insecurity - worry: Never true  . Food insecurity - inability: Never true  . Transportation needs - medical: No  . Transportation needs - non-medical: No  Occupational History  . None  Tobacco Use  . Smoking status: Never Smoker  . Smokeless tobacco: Never Used  Substance and Sexual Activity  . Alcohol use: No  . Drug use: No  . Sexual activity: No  Other Topics Concern  . None  Social History Narrative  . None    Outpatient Encounter Medications as of 07/25/2017  Medication Sig  . apixaban (ELIQUIS) 5 MG TABS tablet Take 1 tablet (5 mg total) by mouth 2 (two) times daily.  Marland Kitchen atorvastatin (LIPITOR) 20 MG tablet Take 1 tablet (20 mg total) by mouth daily.  Marland Kitchen augmented betamethasone dipropionate (DIPROLENE AF) 0.05 % cream Apply topically 2 (two) times daily.  Marland Kitchen oxybutynin (DITROPAN-XL) 10 MG 24 hr tablet TAKE 1 TABLET BY MOUTH EVERYDAY AT BEDTIME  .  sertraline (ZOLOFT) 50 MG tablet TAKE 1 TABLET BY MOUTH EVERY DAY  . verapamil (CALAN-SR) 180 MG CR tablet Take 1 tablet (180 mg total) by mouth at bedtime.  . hydrOXYzine (ATARAX/VISTARIL) 10 MG tablet Take 1 tablet (10 mg total) by mouth 3 (three) times daily as needed. (Patient not taking: Reported on 07/25/2017)  . [DISCONTINUED] verapamil (CALAN-SR) 180 MG CR tablet TAKE 1 TABLET (180 MG TOTAL) BY MOUTH AT BEDTIME.   No facility-administered encounter medications on file as of 07/25/2017.     Activities of Daily Living In your present state of health, do you have any difficulty performing the following activities: 07/25/2017 12/12/2016  Hearing? N Y  Vision? N N  Difficulty concentrating or making decisions? Malvin Johns  Walking or climbing stairs? Y Y  Dressing or bathing? N N   Doing errands, shopping? N N  Preparing Food and eating ? N -  Using the Toilet? N -  In the past six months, have you accidently leaked urine? N -  Comment wears pads just in case -  Do you have problems with loss of bowel control? N -  Managing your Medications? N -  Managing your Finances? N -  Housekeeping or managing your Housekeeping? N -  Some recent data might be hidden    Patient Care Team: Gabriel Cirri, NP as PCP - General (Nurse Practitioner) Iran Ouch, MD as Consulting Physician (Cardiology)    Assessment:   This is a routine wellness examination for Lanier.  Exercise Activities and Dietary recommendations Current Exercise Habits: The patient does not participate in regular exercise at present, Exercise limited by: None identified  Goals    . DIET - INCREASE WATER INTAKE     Recommend drinking at least 6-8 glasses of water a day        Fall Risk Fall Risk  07/25/2017  Falls in the past year? Yes  Number falls in past yr: 1  Injury with Fall? No   Is the patient's home free of loose throw rugs in walkways, pet beds, electrical cords, etc?   yes      Grab bars in the bathroom? yes      Handrails on the stairs?   no      Adequate lighting?   no  Timed Get Up and Go performed: Completed in 9 seconds with no use of assistive devices, steady gait. No intervention needed at this time.   Depression Screen PHQ 2/9 Scores 07/25/2017 02/09/2017 12/12/2016  PHQ - 2 Score 0 0 0  PHQ- 9 Score - 2 9     Cognitive Function     6CIT Screen 07/25/2017  What Year? 0 points  What month? 0 points  What time? 0 points  Count back from 20 0 points  Months in reverse 0 points  Repeat phrase 0 points  Total Score 0    Immunization History  Administered Date(s) Administered  . Influenza, High Dose Seasonal PF 04/02/2017  . Pneumococcal Conjugate-13 11/18/2014  . Pneumococcal Polysaccharide-23 06/19/2006    Qualifies for Shingles Vaccine?yes, discussed shingrix  vaccine   Screening Tests Health Maintenance  Topic Date Due  . TETANUS/TDAP  05/15/2018 (Originally 08/13/1951)  . INFLUENZA VACCINE  Completed  . DEXA SCAN  Completed  . PNA vac Low Risk Adult  Completed    Cancer Screenings: Lung: Low Dose CT Chest recommended if Age 29-80 years, 30 pack-year currently smoking OR have quit w/in 15years. Patient does not qualify. Breast:  Up to date on Mammogram? Yes , no longer required Up to date of Bone Density/Dexa? Yes, no longer required Colorectal: no longer required   Additional Screenings:  Hepatitis B/HIV/Syphillis:not indicated  Hepatitis C Screening: not indicated      Plan:    I have personally reviewed and addressed the Medicare Annual Wellness questionnaire and have noted the following in the patient's chart:  A. Medical and social history B. Use of alcohol, tobacco or illicit drugs  C. Current medications and supplements D. Functional ability and status E.  Nutritional status F.  Physical activity G. Advance directives H. List of other physicians I.  Hospitalizations, surgeries, and ER visits in previous 12 months J.  Vitals K. Screenings such as hearing and vision if needed, cognitive and depression L. Referrals and appointments   In addition, I have reviewed and discussed with patient certain preventive protocols, quality metrics, and best practice recommendations. A written personalized care plan for preventive services as well as general preventive health recommendations were provided to patient.   Signed,  Marin Robertsiffany Hill, LPN Nurse Health Advisor   Nurse Notes:requesting to come of eliquis. States it is getting expensive and feels she doesn't need it anymore.

## 2017-07-25 NOTE — Patient Instructions (Addendum)
Ms. Derrill KayLobermbier , Thank you for taking time to come for your Medicare Wellness Visit. I appreciate your ongoing commitment to your health goals. Please review the following plan we discussed and let me know if I can assist you in the future.   Screening recommendations/referrals: Colonoscopy: no longer required  Mammogram: no longer required Bone Density: completed 07/01/2015 Recommended yearly ophthalmology/optometry visit for glaucoma screening and checkup Recommended yearly dental visit for hygiene and checkup  Vaccinations: Influenza vaccine: up to date  Pneumococcal vaccine: up to date Tdap vaccine: due, check with your insurance company for coverage Shingles vaccine: up to date   Advanced directives: copy on file   Conditions/risks identified: Recommend drinking at least 6-8 glasses of water a day   Next appointment: Follow up on 11/14/2017 at 8:30 with Phineas Inchesheryl Wicker,NP. Follow up in one year for your annual wellness exam.     Preventive Care 65 Years and Older, Female Preventive care refers to lifestyle choices and visits with your health care provider that can promote health and wellness. What does preventive care include?  A yearly physical exam. This is also called an annual well check.  Dental exams once or twice a year.  Routine eye exams. Ask your health care provider how often you should have your eyes checked.  Personal lifestyle choices, including:  Daily care of your teeth and gums.  Regular physical activity.  Eating a healthy diet.  Avoiding tobacco and drug use.  Limiting alcohol use.  Practicing safe sex.  Taking low-dose aspirin every day.  Taking vitamin and mineral supplements as recommended by your health care provider. What happens during an annual well check? The services and screenings done by your health care provider during your annual well check will depend on your age, overall health, lifestyle risk factors, and family history of  disease. Counseling  Your health care provider may ask you questions about your:  Alcohol use.  Tobacco use.  Drug use.  Emotional well-being.  Home and relationship well-being.  Sexual activity.  Eating habits.  History of falls.  Memory and ability to understand (cognition).  Work and work Astronomerenvironment.  Reproductive health. Screening  You may have the following tests or measurements:  Height, weight, and BMI.  Blood pressure.  Lipid and cholesterol levels. These may be checked every 5 years, or more frequently if you are over 82 years old.  Skin check.  Lung cancer screening. You may have this screening every year starting at age 82 if you have a 30-pack-year history of smoking and currently smoke or have quit within the past 15 years.  Fecal occult blood test (FOBT) of the stool. You may have this test every year starting at age 82.  Flexible sigmoidoscopy or colonoscopy. You may have a sigmoidoscopy every 5 years or a colonoscopy every 10 years starting at age 82.  Hepatitis C blood test.  Hepatitis B blood test.  Sexually transmitted disease (STD) testing.  Diabetes screening. This is done by checking your blood sugar (glucose) after you have not eaten for a while (fasting). You may have this done every 1-3 years.  Bone density scan. This is done to screen for osteoporosis. You may have this done starting at age 82.  Mammogram. This may be done every 1-2 years. Talk to your health care provider about how often you should have regular mammograms. Talk with your health care provider about your test results, treatment options, and if necessary, the need for more tests. Vaccines  Your  health care provider may recommend certain vaccines, such as:  Influenza vaccine. This is recommended every year.  Tetanus, diphtheria, and acellular pertussis (Tdap, Td) vaccine. You may need a Td booster every 10 years.  Zoster vaccine. You may need this after age  9.  Pneumococcal 13-valent conjugate (PCV13) vaccine. One dose is recommended after age 54.  Pneumococcal polysaccharide (PPSV23) vaccine. One dose is recommended after age 68. Talk to your health care provider about which screenings and vaccines you need and how often you need them. This information is not intended to replace advice given to you by your health care provider. Make sure you discuss any questions you have with your health care provider. Document Released: 07/02/2015 Document Revised: 02/23/2016 Document Reviewed: 04/06/2015 Elsevier Interactive Patient Education  2017 [place] Prevention in the Home Falls can cause injuries. They can happen to people of all ages. There are many things you can do to make your home safe and to help prevent falls. What can I do on the outside of my home?  Regularly fix the edges of walkways and driveways and fix any cracks.  Remove anything that might make you trip as you walk through a door, such as a raised step or threshold.  Trim any bushes or trees on the path to your home.  Use bright outdoor lighting.  Clear any walking paths of anything that might make someone trip, such as rocks or tools.  Regularly check to see if handrails are loose or broken. Make sure that both sides of any steps have handrails.  Any raised decks and porches should have guardrails on the edges.  Have any leaves, snow, or ice cleared regularly.  Use sand or salt on walking paths during winter.  Clean up any spills in your garage right away. This includes oil or grease spills. What can I do in the bathroom?  Use night lights.  Install grab bars by the toilet and in the tub and shower. Do not use towel bars as grab bars.  Use non-skid mats or decals in the tub or shower.  If you need to sit down in the shower, use a plastic, non-slip stool.  Keep the floor dry. Clean up any water that spills on the floor as soon as it happens.  Remove  soap buildup in the tub or shower regularly.  Attach bath mats securely with double-sided non-slip rug tape.  Do not have throw rugs and other things on the floor that can make you trip. What can I do in the bedroom?  Use night lights.  Make sure that you have a light by your bed that is easy to reach.  Do not use any sheets or blankets that are too big for your bed. They should not hang down onto the floor.  Have a firm chair that has side arms. You can use this for support while you get dressed.  Do not have throw rugs and other things on the floor that can make you trip. What can I do in the kitchen?  Clean up any spills right away.  Avoid walking on wet floors.  Keep items that you use a lot in easy-to-reach places.  If you need to reach something above you, use a strong step stool that has a grab bar.  Keep electrical cords out of the way.  Do not use floor polish or wax that makes floors slippery. If you must use wax, use non-skid floor wax.  Do not  have throw rugs and other things on the floor that can make you trip. What can I do with my stairs?  Do not leave any items on the stairs.  Make sure that there are handrails on both sides of the stairs and use them. Fix handrails that are broken or loose. Make sure that handrails are as long as the stairways.  Check any carpeting to make sure that it is firmly attached to the stairs. Fix any carpet that is loose or worn.  Avoid having throw rugs at the top or bottom of the stairs. If you do have throw rugs, attach them to the floor with carpet tape.  Make sure that you have a light switch at the top of the stairs and the bottom of the stairs. If you do not have them, ask someone to add them for you. What else can I do to help prevent falls?  Wear shoes that:  Do not have high heels.  Have rubber bottoms.  Are comfortable and fit you well.  Are closed at the toe. Do not wear sandals.  If you use a  stepladder:  Make sure that it is fully opened. Do not climb a closed stepladder.  Make sure that both sides of the stepladder are locked into place.  Ask someone to hold it for you, if possible.  Clearly mark and make sure that you can see:  Any grab bars or handrails.  First and last steps.  Where the edge of each step is.  Use tools that help you move around (mobility aids) if they are needed. These include:  Canes.  Walkers.  Scooters.  Crutches.  Turn on the lights when you go into a dark area. Replace any light bulbs as soon as they burn out.  Set up your furniture so you have a clear path. Avoid moving your furniture around.  If any of your floors are uneven, fix them.  If there are any pets around you, be aware of where they are.  Review your medicines with your doctor. Some medicines can make you feel dizzy. This can increase your chance of falling. Ask your doctor what other things that you can do to help prevent falls. This information is not intended to replace advice given to you by your health care provider. Make sure you discuss any questions you have with your health care provider. Document Released: 04/01/2009 Document Revised: 11/11/2015 Document Reviewed: 07/10/2014 Elsevier Interactive Patient Education  2017 [employer].  Your provider would like to you have your annual eye exam. Please contact your current eye doctor or here are some good options for you to contact.   [facility] Address: [address] [place], [place] [address]   Address: [address], [place], [place] [address]  Phone: [phone]      Phone: [phone]  Website: [email or web address]    Website: [email or web address]     [facility]  Address: [address] [place], [place], [place] [address]   Address: [address] [place], [place], [place] [address] Phone: [phone]      Phone: [phone]    [facility] Address: [address] [place], [place], [place] [address]  Phone: [phone]

## 2017-07-27 ENCOUNTER — Ambulatory Visit: Payer: Medicare HMO | Admitting: Cardiovascular Disease

## 2017-08-20 ENCOUNTER — Ambulatory Visit: Payer: Medicare HMO

## 2017-08-24 ENCOUNTER — Other Ambulatory Visit: Payer: Self-pay | Admitting: Unknown Physician Specialty

## 2017-09-22 ENCOUNTER — Other Ambulatory Visit: Payer: Self-pay | Admitting: Unknown Physician Specialty

## 2017-09-24 NOTE — Telephone Encounter (Signed)
Verapamil refill Last OV: 12/12/16 Has upcoming appt 11/14/17 Last Refill:05/15/17 #30 tab 2 RF [facility] [address] PCP: Gabriel Cirriheryl Wicker NP  Oxybutynin [facility] ER refill Last OV: 02/09/17 Last Refill:06/27/16 #30 tabs 2RF [facility] [address], PCP: Gabriel Cirriheryl Wicker NP

## 2017-09-28 ENCOUNTER — Telehealth: Payer: Self-pay | Admitting: Unknown Physician Specialty

## 2017-09-28 MED ORDER — TRIAMCINOLONE ACETONIDE 0.1 % EX CREA: 1.0000 "application " | TOPICAL_CREAM | Freq: Two times a day (BID) | CUTANEOUS | 0 refills | Status: DC

## 2017-09-28 NOTE — Telephone Encounter (Signed)
Called and let patient know that a cream was sent in for her.

## 2017-09-28 NOTE — Telephone Encounter (Signed)
Called and spoke to patient. She states she has a re-occuring rash that comes up in different places. Patient states the rash itches when it comes up, states she has stopped taking her medications because of it. [facility] would [facility] if [facility].

## 2017-09-28 NOTE — Telephone Encounter (Signed)
Copied from CRM [id number]. Topic: Quick Communication - See Telephone Encounter >> Sep 28, 2017 10:34 AM Waymon AmatoBurton, Donna F wrote: CRM for notification. See Telephone encounter for: 09/28/17. Pt is needing to talk with someone regarding a rash that she continues to have   Best numb [phone]

## 2017-11-14 ENCOUNTER — Ambulatory Visit (INDEPENDENT_AMBULATORY_CARE_PROVIDER_SITE_OTHER): Payer: Medicare HMO | Admitting: Unknown Physician Specialty

## 2017-11-14 ENCOUNTER — Other Ambulatory Visit: Payer: Self-pay | Admitting: Unknown Physician Specialty

## 2017-11-14 ENCOUNTER — Encounter: Payer: Self-pay | Admitting: Unknown Physician Specialty

## 2017-11-14 DIAGNOSIS — Z7189 Other specified counseling: Secondary | ICD-10-CM

## 2017-11-14 DIAGNOSIS — I10 Essential (primary) hypertension: Secondary | ICD-10-CM | POA: Diagnosis not present

## 2017-11-14 DIAGNOSIS — Z8679 Personal history of other diseases of the circulatory system: Secondary | ICD-10-CM | POA: Diagnosis not present

## 2017-11-14 DIAGNOSIS — L509 Urticaria, unspecified: Secondary | ICD-10-CM

## 2017-11-14 DIAGNOSIS — E7800 Pure hypercholesterolemia, unspecified: Secondary | ICD-10-CM | POA: Diagnosis not present

## 2017-11-14 DIAGNOSIS — R0689 Other abnormalities of breathing: Secondary | ICD-10-CM | POA: Diagnosis not present

## 2017-11-14 DIAGNOSIS — Z66 Do not resuscitate: Secondary | ICD-10-CM

## 2017-11-14 MED ORDER — DOXEPIN HCL 10 MG PO CAPS: 10.0000 mg | ORAL_CAPSULE | Freq: Every day | ORAL | 2 refills | Status: DC

## 2017-11-14 MED ORDER — ATORVASTATIN CALCIUM 20 MG PO TABS: 20.0000 mg | ORAL_TABLET | Freq: Every day | ORAL | 3 refills | Status: DC

## 2017-11-14 MED ORDER — OXYBUTYNIN CHLORIDE ER 10 MG PO TB24: 10.0000 mg | ORAL_TABLET | Freq: Every day | ORAL | 1 refills | Status: DC

## 2017-11-14 NOTE — Assessment & Plan Note (Signed)
Chooses not to take her BP medication.  It is good with good rate control

## 2017-11-14 NOTE — Assessment & Plan Note (Signed)
Patient chooses to be a DNR.  Form signed

## 2017-11-14 NOTE — Assessment & Plan Note (Signed)
A voluntary discussion about advance care planning including the explanation and discussion of advance directives was extensively discussed  with the patient.  Explanation about the health care proxy and Living will was reviewed and packet with forms with explanation of how to fill them out was given.  During this discussion, the patient was able to identify a health care proxy as her son and has papers from [facility] and did not realize she needed some in the state of [place].  She would like to be a DNR.    Patient was offered a separate Advance Care Planning visit for further assistance with forms.  Time spent:  Over 15 minutes     Individuals present: pateint

## 2017-11-14 NOTE — Assessment & Plan Note (Signed)
Suspect heart failure.  Get chest x-ray.  Refer to cardiology for further evaluation.  Pt does not want medication.  Discussed will try to come up with a plan for least amount of medications for benefit.

## 2017-11-14 NOTE — Assessment & Plan Note (Signed)
To taking Atorvastatin.  Prefers not to take for risk reduction.

## 2017-11-14 NOTE — Telephone Encounter (Signed)
Patient states she needs her scripts sent to [facility] in [place].  She is requesting a refill on Oxybutynin also be sent there.    Thank you

## 2017-11-14 NOTE — Progress Notes (Signed)
BP 138/81   Pulse 75   Temp 98.2 F (36.8 C) (Oral)   Ht  (1.499 m)   Wt 185 lb 12.8 oz (84.3 kg)   SpO2 94%   BMI 37.53 kg/m    Subjective:    Patient ID: Holly Lewis, female    DOB: September 22, 1932, 83 y.o.   MRN: [id number]  HPI: Holly Lewis is a 84 y.o. female  Chief Complaint  Patient presents with  . Anxiety  . Hyperlipidemia  . Hypertension   Hypertension Stopped blood pressure medication Average home BPs   No problems or lightheadedness No chest pain with exertion or shortness of breath No Edema   Hyperlipidemia Stopped Atorvastatin.  Taking something OTC Using medications without problems: No Muscle aches  Diet compliance: Exercise:  Anxiety Uses Zoloft.  Struggles with overeating Depression screen [facility] 2/9 11/14/2017 07/25/2017 02/09/2017 12/12/2016  Decreased Interest 0 0 0 0  Down, Depressed, Hopeless 0 0 0 0  PHQ - 2 Score 0 0 0 0  Altered sleeping 0 - 2 3  Tired, decreased energy 0 - 0 3  Change in appetite 3 - 0 3  Feeling bad or failure about yourself  0 - 0 0  Trouble concentrating 0 - 0 0  Moving slowly or fidgety/restless 0 - 0 0  Suicidal thoughts 0 - 0 0  PHQ-9 Score 3 - 2 9   Urticaria Driving her crazy.  Better the last 5 days.  Not sure why.  Has eliminated all her medication.  Did not take Hydroxyzine for insurance reasons.    A-fib Chooses to not take Eliquis.     Relevant past medical, surgical, family and social history reviewed and updated as indicated. Interim medical history since our last visit reviewed. Allergies and medications reviewed and updated.  Review of Systems  Respiratory: Positive for shortness of breath.   Cardiovascular: Positive for leg swelling.    Per HPI unless specifically indicated above     Objective:    BP 138/81   Pulse 75   Temp 98.2 F (36.8 C) (Oral)   Ht  (1.499 m)   Wt 185 lb 12.8 oz (84.3 kg)   SpO2 94%   BMI 37.53 kg/m   Wt Readings from Last 3 Encounters:    11/14/17 185 lb 12.8 oz (84.3 kg)  07/25/17 180 lb 9.6 oz (81.9 kg)  05/15/17 178 lb 12.8 oz (81.1 kg)    Physical Exam  Constitutional: She is oriented to person, place, and time. She appears well-developed and well-nourished. No distress.  HENT:  Head: Normocephalic and atraumatic.  Eyes: Conjunctivae and lids are normal. Right eye exhibits no discharge. Left eye exhibits no discharge. No scleral icterus.  Neck: Normal range of motion. Neck supple. No JVD present. Carotid bruit is not present.  Cardiovascular: Normal heart sounds. An irregularly irregular rhythm present.  Pulmonary/Chest: Effort normal. She has no decreased breath sounds. She has rales in the right lower field and the left lower field.  Abdominal: Normal appearance. There is no splenomegaly or hepatomegaly.  Musculoskeletal: Normal range of motion.  Neurological: She is alert and oriented to person, place, and time.  Skin: Skin is warm, dry and intact. No rash noted. No pallor.  Psychiatric: She has a normal mood and affect. Her behavior is normal. Judgment and thought content normal.    Results for orders placed or performed in visit on 05/15/17  Comprehensive metabolic panel  Result Value Ref Range   Glucose  84 65 - 99 mg/dL   BUN 14 8 - 27 mg/dL   Creatinine, Ser 8.11 0.57 - 1.00 mg/dL   GFR calc non Af Amer 76 >59 mL/min/1.73   GFR calc Af Amer 87 >59 mL/min/1.73   BUN/Creatinine Ratio 19 12 - 28   Sodium 138 134 - 144 mmol/L   Potassium 4.6 3.5 - 5.2 mmol/L   Chloride 104 96 - 106 mmol/L   CO2 21 20 - 29 mmol/L   Calcium 9.3 8.7 - 10.3 mg/dL   Total Protein 7.4 6.0 - 8.5 g/dL   Albumin 4.3 3.5 - 4.7 g/dL   Globulin, Total 3.1 1.5 - 4.5 g/dL   Albumin/Globulin Ratio 1.4 1.2 - 2.2   Bilirubin Total 0.3 0.0 - 1.2 mg/dL   Alkaline Phosphatase 83 39 - 117 IU/L   AST 23 0 - 40 IU/L   ALT 16 0 - 32 IU/L  Lipid Panel w/o Chol/HDL Ratio  Result Value Ref Range   Cholesterol, Total 199 100 - 199 mg/dL    Triglycerides 914 0 - 149 mg/dL   HDL 58 >78 mg/dL   VLDL Cholesterol Cal 20 5 - 40 mg/dL   LDL Calculated 295 (H) 0 - 99 mg/dL  Alpha Gal IgE  Result Value Ref Range   Alpha Gal IgE* <0.10 <0.10 kU/L      Assessment & Plan:   Problem List Items Addressed This Visit      Unprioritized   Advanced care planning/counseling discussion    A voluntary discussion about advance care planning including the explanation and discussion of advance directives was extensively discussed  with the patient.  Explanation about the health care proxy and Living will was reviewed and packet with forms with explanation of how to fill them out was given.  During this discussion, the patient was able to identify a health care proxy as her son and has papers from [facility] and did not realize she needed some in the state of [place].  She would like to be a DNR.    Patient was offered a separate Advance Care Planning visit for further assistance with forms.  Time spent:  Over 15 minutes     Individuals present: pateint       Adventitious breath sounds    Suspect heart failure.  Get chest x-ray.  Refer to cardiology for further evaluation.  Pt does not want medication.  Discussed will try to come up with a plan for least amount of medications for benefit.        Relevant Orders   Ambulatory referral to Cardiology   DG Chest 2 View   DNR (do not resuscitate)    Patient chooses to be a DNR.  Form signed      Essential hypertension, benign    Chooses not to take her BP medication.  It is good with good rate control      History of atrial fibrillation    Pt would prefer not to take Eliquis.  She understands the risks after a discussion of risk/benefit ratio.        Relevant Orders   Ambulatory referral to Cardiology   DG Chest 2 View   Hypercholesteremia    To taking Atorvastatin.  Prefers not to take for risk reduction.        Urticaria    Biggest quality of life problem.  Start Doxepin 10 mg QHS.  Recheck  in 4 weeks  Consider dermatology referral  Follow up plan: Return in about 5 weeks (around 12/19/2017).

## 2017-11-14 NOTE — Telephone Encounter (Signed)
Routing to provider. Pharmacy updated in chart.  

## 2017-11-14 NOTE — Assessment & Plan Note (Addendum)
Pt would prefer not to take Eliquis.  She understands the risks after a discussion of risk/benefit ratio.

## 2017-11-14 NOTE — Assessment & Plan Note (Addendum)
Biggest quality of life problem.  Start Doxepin 10 mg QHS.  Recheck in 4 weeks

## 2017-11-21 ENCOUNTER — Ambulatory Visit
Admission: RE | Admit: 2017-11-21 | Discharge: 2017-11-21 | Disposition: A | Discharge status: Home / Self Care | Payer: Medicare HMO | Source: Ambulatory Visit | Attending: Unknown Physician Specialty | Admitting: Unknown Physician Specialty

## 2017-11-21 DIAGNOSIS — Z8679 Personal history of other diseases of the circulatory system: Secondary | ICD-10-CM | POA: Diagnosis not present

## 2017-11-21 DIAGNOSIS — R05 Cough: Secondary | ICD-10-CM | POA: Diagnosis not present

## 2017-11-21 DIAGNOSIS — R0689 Other abnormalities of breathing: Secondary | ICD-10-CM | POA: Diagnosis not present

## 2017-11-21 DIAGNOSIS — I509 Heart failure, unspecified: Secondary | ICD-10-CM | POA: Insufficient documentation

## 2017-11-21 DIAGNOSIS — I517 Cardiomegaly: Secondary | ICD-10-CM | POA: Diagnosis not present

## 2017-12-26 ENCOUNTER — Encounter: Payer: Self-pay | Admitting: Unknown Physician Specialty

## 2017-12-26 ENCOUNTER — Ambulatory Visit (INDEPENDENT_AMBULATORY_CARE_PROVIDER_SITE_OTHER): Payer: Medicare HMO | Admitting: Unknown Physician Specialty

## 2017-12-26 DIAGNOSIS — L509 Urticaria, unspecified: Secondary | ICD-10-CM

## 2017-12-26 DIAGNOSIS — N3941 Urge incontinence: Secondary | ICD-10-CM

## 2017-12-26 MED ORDER — DOXEPIN HCL 10 MG PO CAPS: 10.0000 mg | ORAL_CAPSULE | Freq: Every day | ORAL | 12 refills | Status: DC

## 2017-12-26 MED ORDER — OXYBUTYNIN CHLORIDE ER 10 MG PO TB24: 10.0000 mg | ORAL_TABLET | Freq: Every day | ORAL | 12 refills | Status: DC

## 2017-12-26 NOTE — Assessment & Plan Note (Signed)
Almost resolved with Doxepin QHS.

## 2017-12-26 NOTE — Progress Notes (Signed)
BP 124/85   Pulse 89   Temp 98.3 F (36.8 C) (Oral)   Ht 4\' 11"  (1.499 m)   Wt 189 lb 3.2 oz (85.8 kg)   SpO2 96%   BMI 38.21 kg/m    Subjective:    Patient ID: Holly Lewis, female    DOB: January 19, 1933, 84 y.o.   MRN: [id number]  HPI: Holly Lewis is a 84 y.o. female  Chief Complaint  Patient presents with  . Urticaria    1 month f/up   Urticaria Last visit, pt given Doxepin for itching.  Pt is here to f/u.  States the medication has been "like a miracle" and itching is "almost gone."    A-fib Last visit also referred to cardiology but hasn't yet been.  Referral was made to [facility] but they have not called.  At this point, refused medication of a-fib and cholesterol.    Stress incontinence Takes Ditropan and wishes to continue. Risks and benefits discussed.    Relevant past medical, surgical, family and social history reviewed and updated as indicated. Interim medical history since our last visit reviewed. Allergies and medications reviewed and updated.  Review of Systems  Per HPI unless specifically indicated above     Objective:    BP 124/85   Pulse 89   Temp 98.3 F (36.8 C) (Oral)   Ht 4\' 11"  (1.499 m)   Wt 189 lb 3.2 oz (85.8 kg)   SpO2 96%   BMI 38.21 kg/m   Wt Readings from Last 3 Encounters:  12/26/17 189 lb 3.2 oz (85.8 kg)  11/14/17 185 lb 12.8 oz (84.3 kg)  07/25/17 180 lb 9.6 oz (81.9 kg)    Physical Exam  Constitutional: She is oriented to person, place, and time. She appears well-developed and well-nourished. No distress.  HENT:  Head: Normocephalic and atraumatic.  Eyes: Conjunctivae and lids are normal. Right eye exhibits no discharge. Left eye exhibits no discharge. No scleral icterus.  Cardiovascular: An irregularly irregular rhythm present.  Pulmonary/Chest: Effort normal.  Abdominal: Normal appearance. There is no splenomegaly or hepatomegaly.  Musculoskeletal: Normal range of motion.  Neurological: She is alert and  oriented to person, place, and time.  Skin: Skin is intact. No rash noted. No pallor.  Psychiatric: She has a normal mood and affect. Her behavior is normal. Judgment and thought content normal.    Results for orders placed or performed in visit on 05/15/17  Comprehensive metabolic panel  Result Value Ref Range   Glucose 84 65 - 99 mg/dL   BUN 14 8 - 27 mg/dL   Creatinine, Ser 2.72 0.57 - 1.00 mg/dL   GFR calc non Af Amer 76 >59 mL/min/1.73   GFR calc Af Amer 87 >59 mL/min/1.73   BUN/Creatinine Ratio 19 12 - 28   Sodium 138 134 - 144 mmol/L   Potassium 4.6 3.5 - 5.2 mmol/L   Chloride 104 96 - 106 mmol/L   CO2 21 20 - 29 mmol/L   Calcium 9.3 8.7 - 10.3 mg/dL   Total Protein 7.4 6.0 - 8.5 g/dL   Albumin 4.3 3.5 - 4.7 g/dL   Globulin, Total 3.1 1.5 - 4.5 g/dL   Albumin/Globulin Ratio 1.4 1.2 - 2.2   Bilirubin Total 0.3 0.0 - 1.2 mg/dL   Alkaline Phosphatase 83 39 - 117 IU/L   AST 23 0 - 40 IU/L   ALT 16 0 - 32 IU/L  Lipid Panel w/o Chol/HDL Ratio  Result Value Ref Range  Cholesterol, Total 199 100 - 199 mg/dL   Triglycerides 161101 0 - 149 mg/dL   HDL 58 >09>39 mg/dL   VLDL Cholesterol Cal 20 5 - 40 mg/dL   LDL Calculated 604121 (H) 0 - 99 mg/dL  Alpha Gal IgE  Result Value Ref Range   Alpha Gal IgE* <0.10 <0.10 kU/L      Assessment & Plan:   Problem List Items Addressed This Visit      Unprioritized   Urge incontinence of urine    Refill Ditropan.  Risks and benefits discussed      Relevant Medications   oxybutynin (DITROPAN-XL) 10 MG 24 hr tablet   Urticaria    Almost resolved with Doxepin QHS.            Follow up plan: Return in about 3 months (around 03/28/2018).

## 2017-12-26 NOTE — Assessment & Plan Note (Signed)
Refill Ditropan.  Risks and benefits discussed

## 2017-12-27 ENCOUNTER — Telehealth: Payer: Self-pay

## 2017-12-27 NOTE — Telephone Encounter (Signed)
Provider sent a staff message asking for me to call and f/up on cardiology referral because patient states she has not heard anything. Called [facility] to [facility] to f/up on referral. They state that they have tried to contact the patient several times. I tried to call the patient to let her know this and give her their phone number to call and schedule her appointment. Their phone number is [phone]. Patient did not answer and I could not leave a VM as VM box was full. Will try to call back later. If patient calls back in the meantime, ok for [facility] to let the patient know this information.

## 2017-12-28 NOTE — Telephone Encounter (Signed)
Tried calling patient. No answer and VM box was still full so I could not leave a VM. Will try to call again later.

## 2018-01-07 NOTE — Telephone Encounter (Signed)
Letter generated and sent to patient.  

## 2018-01-07 NOTE — Telephone Encounter (Signed)
Tried calling patient. No answer and VM box was full. Will try to call again later.

## 2018-02-19 ENCOUNTER — Ambulatory Visit: Payer: Medicare HMO | Admitting: Nurse Practitioner

## 2018-02-19 ENCOUNTER — Encounter: Payer: Self-pay | Admitting: Nurse Practitioner

## 2018-02-19 VITALS — BP 154/88 | Ht 61.0 in | Wt 188.8 lb

## 2018-02-19 DIAGNOSIS — R079 Chest pain, unspecified: Secondary | ICD-10-CM

## 2018-02-19 DIAGNOSIS — I480 Paroxysmal atrial fibrillation: Secondary | ICD-10-CM | POA: Diagnosis not present

## 2018-02-19 DIAGNOSIS — I10 Essential (primary) hypertension: Secondary | ICD-10-CM | POA: Diagnosis not present

## 2018-02-19 NOTE — Progress Notes (Signed)
Office Visit    Patient Name: Holly Lewis Date of Encounter: 02/19/2018  Primary Care Provider:  Gabriel Cirri, NP Primary Cardiologist:  Lorine Bears, MD  Chief Complaint    84 year old female with a history of paroxysmal atrial fibrillation, hypertension, hyperlipidemia, anxiety, cataracts, and chest pain with reportedly normal stress test in the past, who presents for follow-up of chest pain and dyspnea.  Past Medical History    Past Medical History:  Diagnosis Date  . Anxiety   . Blood transfusion without reported diagnosis   . Cataract   . Chest pain    a. Pt reports nl stress test in 2016 in [place]; b. 05/2017 declined stress test for recurrent c/p.  Marland Kitchen Hyperlipidemia   . Hypertension   . PAF (paroxysmal atrial fibrillation) (HCC)    a. Dx in 2017 in [place] per pt-->s/p TEE/DCCV;  b. CHA2DS2VASc = 4-->Eliquis.   Past Surgical History:  Procedure Laterality Date  . CHOLECYSTECTOMY    . EYE SURGERY    . 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 kidney stones      Allergies  Allergies  Allergen Reactions  . Diltiazem Rash  . Latex Rash    History of Present Illness    84 year old female with the above past medical history including hypertension, hyperlipidemia, cataracts, anxiety, and chest pain.  She reportedly underwent stress testing in 2016 which was normal.  Paroxysmal atrial fibrillation was apparently diagnosed in 2017 and she is previously described to [place] that she underwent TEE and cardioversion at that time.  She was on Eliquis at that time but subsequently came off of it simply because she could not get it refilled.  She was referred to Dr. Kirke Corin in November 2018 and was placed back on Eliquis.  She also reported a 2 to 58-month history of intermittent chest tightness and a Lexiscan Myoview was ordered.  Patient opted to forego stress testing and reported to our nursing staff over the phone that chest pain had subsequently  resolved and she preferred to follow-up PRN.  Since her last visit, she initially says that she has done well.  She uses a mop handle as a staff/cane but says she is very active and ambulates frequently.  Her son is with her today and agrees.  She has not had any recurrence of chest discomfort since her last visit at the end of last year.  She initially denies any dyspnea on exertion but then when I point out that she had a chest x-ray in June due to concerns of abnormal lung sounds, she says that she does remember having shortness of breath at that time but this has not recurred.  Chest x-ray did not show any active disease.  Also since her last visit, she has come off of all of her medications.  She says she cannot afford Eliquis and is not interested in replacing it with an alternate DOAC or coumadin.  We discussed her stroke risk at length today.  Blood pressure is elevated today and she says she checks it regularly at home and it is typically about 120/80.  She is not on any blood pressure medications, saying that everything she is tried before caused a rash.  She denies palpitations, PND, orthopnea, dizziness, syncope, edema, or early satiety.  Home Medications    Prior to Admission medications   Medication Sig Start Date End Date Taking? Authorizing Provider  atorvastatin (LIPITOR)  20 MG tablet Take 1 tablet (20 mg total) by mouth daily. Patient not taking: Reported on 12/26/2017 11/14/17   Gabriel Cirri, NP  doxepin (SINEQUAN) 10 MG capsule Take 1 capsule (10 mg total) by mouth at bedtime. 12/26/17   Gabriel Cirri, NP  oxybutynin (DITROPAN-XL) 10 MG 24 hr tablet Take 1 tablet (10 mg total) by mouth at bedtime. 12/26/17   Gabriel Cirri, NP    Review of Systems    She had some dyspnea in the late spring but says this has resolved.  She denies chest pain, palpitations, PND, orthopnea, dizziness, syncope, edema, or early satiety.  She does have somewhat of an unsteady gait and uses a mop handle to  support herself during ambulation.  All other systems reviewed and are otherwise negative except as noted above.  Physical Exam    VS:  BP (!) 154/88 (BP Location: Left Arm, Patient Position: Sitting, Cuff Size: Large)   Ht 5\' 1"  (1.549 m)   Wt 188 lb 12 oz (85.6 kg)   BMI 35.66 kg/m  , BMI Body mass index is 35.66 kg/m. GEN: Well nourished, well developed, in no acute distress. HEENT: normal. Neck: Supple, no JVD, carotid bruits, or masses. Cardiac: RRR, no murmurs, rubs, or gallops. No clubbing, cyanosis, edema.  Radials/DP/PT 2+ and equal bilaterally.  Respiratory:  Respirations regular and unlabored, clear to auscultation bilaterally. GI: Obese, soft, nontender, nondistended, BS + x 4. MS: no deformity or atrophy. Skin: warm and dry, no rash. Neuro:  Strength and sensation are intact. Psych: Normal affect.  Accessory Clinical Findings    ECG personally reviewed by me today -regular sinus rhythm, 85, left axis deviation, left atrial enlargement, delayed R wave progression, no acute changes.  Assessment & Plan    1.  Paroxysmal atrial fibrillation: This was diagnosed in [place] in 2017 in the setting of a 2-week hospitalization for pneumonia.  She says she underwent TEE and cardioversion at that time.  She was on Eliquis for about a year afterwards but then could not get it refilled and then when she saw Dr. Kirke Corin last winter, she was placed back on Eliquis.  She says it was too expensive and subsequently came off of it.  We had a discussion about her stroke risk and the role of anticoagulation in patients with paroxysmal atrial fibrillation today.  Her son was present for this.  CHA2DS2VASc equals 4.  She prefers to remain off of oral anticoagulation.  She is also not on any AV nodal blocking agent and given issues with other medications that of cause rash, including diltiazem, she does not wish to start anything at this time.  2.  Essential hypertension: Blood pressure is 154/88  today.  I repeated this and got 160/92.  She says she checks her blood pressure frequently at home and is always in the 120/80 range.  As she is reluctant to consider any medications at this time, I have recommended that she follow her blood pressure daily over the next week and call us with results as we would have a low threshold to add an antihypertensive agent if she persistently has systolics greater than 130.  She agreed to do this.  Given history of A. fib, a beta-blocker would be reasonable choice.  3.  Dyspnea: Patient was was noted to have crackles on exam by primary care in May.  She denied dyspnea at the time but when I asked her about it today, she says she might of had  some shortness of breath back then.  Chest x-ray did not show any acute findings.  She denies dyspnea today.  We did discuss the potential role for an echocardiogram in patients who are dyspneic however as she is feeling well without recurrence, we have collectively agreed to forego this at this time.  4.  Chest pain:  Reported last November but refused stress testing @ that time.  No recurrence.  5. Disposition: Patient prefers to remain off of all medications at this time, including oral anticoagulant.  We spoke about this at length today.  She prefers to continue to monitor her blood pressures at home and has agreed to contact us within a week or so with numbers as she may require treatment for hypertension.  Follow-up with Dr. Kirke Corin in 6 months or sooner if necessary.   Nicolasa Ducking, NP 02/19/2018, 10:09 AM

## 2018-02-19 NOTE — Patient Instructions (Signed)
Medication Instructions:  Your physician recommends that you continue on your current medications as directed. Please refer to the Current Medication list given to you today.   Labwork: none  Testing/Procedures: none  Follow-Up: Your physician recommends that you schedule a follow-up appointment in: 6 MONTHS WITH DR ARIDA.   If you need a refill on your cardiac medications before your next appointment, please call your pharmacy.   

## 2018-02-19 NOTE — Addendum Note (Signed)
Addended by: Aurelio Jew on: 02/19/2018 03:24 PM   Modules accepted: Orders

## 2018-04-02 ENCOUNTER — Ambulatory Visit: Payer: Medicare HMO | Admitting: Nurse Practitioner

## 2018-04-02 ENCOUNTER — Ambulatory Visit: Payer: Self-pay | Admitting: Nurse Practitioner

## 2018-04-29 ENCOUNTER — Ambulatory Visit: Payer: Self-pay | Admitting: Nurse Practitioner

## 2018-05-01 ENCOUNTER — Ambulatory Visit (INDEPENDENT_AMBULATORY_CARE_PROVIDER_SITE_OTHER): Payer: Medicare HMO | Admitting: Nurse Practitioner

## 2018-05-01 ENCOUNTER — Encounter: Payer: Self-pay | Admitting: Nurse Practitioner

## 2018-05-01 ENCOUNTER — Other Ambulatory Visit: Payer: Self-pay

## 2018-05-01 VITALS — BP 138/87 | HR 89 | Temp 98.1°F | Ht 61.0 in | Wt 190.0 lb

## 2018-05-01 DIAGNOSIS — Z23 Encounter for immunization: Secondary | ICD-10-CM | POA: Diagnosis not present

## 2018-05-01 DIAGNOSIS — E7800 Pure hypercholesterolemia, unspecified: Secondary | ICD-10-CM | POA: Diagnosis not present

## 2018-05-01 DIAGNOSIS — L209 Atopic dermatitis, unspecified: Secondary | ICD-10-CM

## 2018-05-01 DIAGNOSIS — I10 Essential (primary) hypertension: Secondary | ICD-10-CM | POA: Diagnosis not present

## 2018-05-01 MED ORDER — TRIAMCINOLONE 0.1 % CREAM:EUCERIN CREAM 1:1: 1.0000 "application " | TOPICAL_CREAM | Freq: Two times a day (BID) | CUTANEOUS | 3 refills | Status: DC | PRN

## 2018-05-01 NOTE — Progress Notes (Signed)
BP 138/87   Pulse 89   Temp 98.1 F (36.7 C) (Oral)   Ht 5\' 1"  (1.549 m)   Wt 190 lb (86.2 kg)   SpO2 92%   BMI 35.90 kg/m    Subjective:    Patient ID: Holly Lewis, female    DOB: 06-Jan-1933, 84 y.o.   MRN: [id number]  HPI: Holly Lewis is a 84 y.o. female presents for follow-up urticaria, HTN/HLD  Chief Complaint  Patient presents with  . Urticaria    67m f/u   URTICARIA: She reports issue had been improving, but recently noticed a new patch of red/dry skin to right middle finger.  Has been putting hydrocortisone on area, which she states "helps with the itching".  Has been present for one week.  The last time she had break out "has been close to a year".  She reports no new detergent, body wash, shampoo, foods, medications. States she feels these areas present more around this time of year.  HYPERTENSION / HYPERLIPIDEMIA/ATRIAL FIBRILLATION Saw cardiology last 02/19/18.  Continues to decline anticoag or rate control therapy. She reports her daily BP readings at home are below goal, as noted below.  No current HTN or HLD therapy, declines. Satisfied with current treatment? yes Duration of hypertension: chronic BP monitoring frequency: a few times a week BP range: 120/80 and below at home BP medication side effects: no Past BP meds: does not recall Duration of hyperlipidemia: chronic Cholesterol medication side effects: no Cholesterol supplements: none Past cholesterol medications: Atorvastatin Medication compliance: excellent compliance Aspirin: no Recent stressors: no Recurrent headaches: no Visual changes: no Palpitations: no Dyspnea: no Chest pain: no Lower extremity edema: no Dizzy/lightheaded: no  Relevant past medical, surgical, family and social history reviewed and updated as indicated. Interim medical history since our last visit reviewed. Allergies and medications reviewed and updated.  Review of Systems  Constitutional: Negative for activity  change, appetite change, diaphoresis, fatigue and fever.  Respiratory: Negative for cough, chest tightness and shortness of breath.   Cardiovascular: Negative for chest pain, palpitations and leg swelling.  Gastrointestinal: Negative for abdominal distention, abdominal pain, constipation, diarrhea, nausea and vomiting.  Endocrine: Negative.   Musculoskeletal: Negative for arthralgias, back pain, joint swelling, myalgias and neck pain.  Skin: Negative for color change.       Dry skin patch to right middle finger.  Neurological: Negative for dizziness, numbness and headaches.  Psychiatric/Behavioral: Negative.     Per HPI unless specifically indicated above     Objective:    BP 138/87   Pulse 89   Temp 98.1 F (36.7 C) (Oral)   Ht 5\' 1"  (1.549 m)   Wt 190 lb (86.2 kg)   SpO2 92%   BMI 35.90 kg/m   Wt Readings from Last 3 Encounters:  05/01/18 190 lb (86.2 kg)  02/19/18 188 lb 12 oz (85.6 kg)  12/26/17 189 lb 3.2 oz (85.8 kg)    Physical Exam  Constitutional: She is oriented to person, place, and time. She appears well-developed and well-nourished.  HENT:  Head: Normocephalic and atraumatic.  Right Ear: Hearing normal.  Left Ear: Hearing normal.  Nose: Nose normal.  Mouth/Throat: Uvula is midline, oropharynx is clear and moist and mucous membranes are normal.  Eyes: Pupils are equal, round, and reactive to light. Conjunctivae and EOM are normal. Right eye exhibits no discharge. Left eye exhibits no discharge.  Neck: Normal range of motion. Neck supple. No JVD present. Carotid bruit is not present. No thyromegaly  present.  Cardiovascular: Normal rate, regular rhythm, normal heart sounds and intact distal pulses.  Pulmonary/Chest: Effort normal and breath sounds normal.  Abdominal: Soft. Bowel sounds are normal. There is no splenomegaly or hepatomegaly.  Musculoskeletal: Normal range of motion.  Lymphadenopathy:    She has no cervical adenopathy.  Neurological: She is alert  and oriented to person, place, and time. She has normal reflexes.  Skin: Skin is warm and dry.  Dry patch from proximal phalangeal joint right middle finger, mild erythema with white flaky skin noted.  No other areas of erythema or dry patches.  Psychiatric: She has a normal mood and affect. Her behavior is normal.    Results for orders placed or performed in visit on 05/15/17  Comprehensive metabolic panel  Result Value Ref Range   Glucose 84 65 - 99 mg/dL   BUN 14 8 - 27 mg/dL   Creatinine, Ser 4.090.74 0.57 - 1.00 mg/dL   GFR calc non Af Amer 76 >59 mL/min/1.73   GFR calc Af Amer 87 >59 mL/min/1.73   BUN/Creatinine Ratio 19 12 - 28   Sodium 138 134 - 144 mmol/L   Potassium 4.6 3.5 - 5.2 mmol/L   Chloride 104 96 - 106 mmol/L   CO2 21 20 - 29 mmol/L   Calcium 9.3 8.7 - 10.3 mg/dL   Total Protein 7.4 6.0 - 8.5 g/dL   Albumin 4.3 3.5 - 4.7 g/dL   Globulin, Total 3.1 1.5 - 4.5 g/dL   Albumin/Globulin Ratio 1.4 1.2 - 2.2   Bilirubin Total 0.3 0.0 - 1.2 mg/dL   Alkaline Phosphatase 83 39 - 117 IU/L   AST 23 0 - 40 IU/L   ALT 16 0 - 32 IU/L  Lipid Panel w/o Chol/HDL Ratio  Result Value Ref Range   Cholesterol, Total 199 100 - 199 mg/dL   Triglycerides 811101 0 - 149 mg/dL   HDL 58 >91>39 mg/dL   VLDL Cholesterol Cal 20 5 - 40 mg/dL   LDL Calculated 478121 (H) 0 - 99 mg/dL  Alpha Gal IgE  Result Value Ref Range   Alpha Gal IgE* <0.10 <0.10 kU/L      Assessment & Plan:   Problem List Items Addressed This Visit      Cardiovascular and Mediastinum   Essential hypertension, benign    Chronic, no current medications.  Declines treatment.  Reports below goal BP at home.  Continue to collaborate with cardiology.        Musculoskeletal and Integument   Atopic dermatitis    Chronic, improved with Doxepin, but currently has small area to right middle finger.  Order for Triamcinolone/Eucerin cream provided.        Other   Hypercholesteremia    Chronic, no statin.  Declines therapy at this  time.       Other Visit Diagnoses    Flu vaccine need    -  Primary   Relevant Orders   Flu vaccine HIGH DOSE PF       Follow up plan: Return in about 6 months (around 10/30/2018) for HTN/HLD.

## 2018-05-01 NOTE — Assessment & Plan Note (Signed)
Chronic, improved with Doxepin, but currently has small area to right middle finger.  Order for Triamcinolone/Eucerin cream provided.

## 2018-05-01 NOTE — Patient Instructions (Signed)

## 2018-05-01 NOTE — Assessment & Plan Note (Addendum)
Chronic, no current medications.  Declines treatment.  Reports below goal BP at home.  Continue to collaborate with cardiology.

## 2018-05-01 NOTE — Assessment & Plan Note (Signed)
Chronic, no statin.  Declines therapy at this time.

## 2018-05-23 ENCOUNTER — Encounter: Payer: Self-pay | Admitting: Emergency Medicine

## 2018-05-23 ENCOUNTER — Emergency Department: Payer: Medicare HMO

## 2018-05-23 ENCOUNTER — Other Ambulatory Visit: Payer: Self-pay

## 2018-05-23 ENCOUNTER — Emergency Department
Admission: EM | Admit: 2018-05-23 | Discharge: 2018-05-23 | Disposition: A | Discharge status: Home / Self Care | Payer: Medicare HMO | Attending: Emergency Medicine | Admitting: Emergency Medicine

## 2018-05-23 DIAGNOSIS — Z79899 Other long term (current) drug therapy: Secondary | ICD-10-CM | POA: Insufficient documentation

## 2018-05-23 DIAGNOSIS — M160 Bilateral primary osteoarthritis of hip: Secondary | ICD-10-CM | POA: Insufficient documentation

## 2018-05-23 DIAGNOSIS — M1611 Unilateral primary osteoarthritis, right hip: Secondary | ICD-10-CM | POA: Diagnosis not present

## 2018-05-23 DIAGNOSIS — I10 Essential (primary) hypertension: Secondary | ICD-10-CM | POA: Diagnosis not present

## 2018-05-23 DIAGNOSIS — M25552 Pain in left hip: Secondary | ICD-10-CM | POA: Diagnosis not present

## 2018-05-23 DIAGNOSIS — M779 Enthesopathy, unspecified: Secondary | ICD-10-CM

## 2018-05-23 DIAGNOSIS — M1612 Unilateral primary osteoarthritis, left hip: Secondary | ICD-10-CM | POA: Diagnosis not present

## 2018-05-23 MED ORDER — PREDNISONE 10 MG PO TABS: 40.0000 mg | ORAL_TABLET | Freq: Every day | ORAL | 0 refills | Status: AC

## 2018-05-23 MED ORDER — OXYCODONE HCL 5 MG PO TABS
2.5000 mg | ORAL_TABLET | Freq: Once | ORAL | Status: AC
  Administered 2018-05-23: 2.5 mg via ORAL
  Filled 2018-05-23: qty 1

## 2018-05-23 MED ORDER — LIDOCAINE 5 % EX PTCH
1.0000 | MEDICATED_PATCH | CUTANEOUS | Status: DC
  Administered 2018-05-23: 1 via TRANSDERMAL
  Filled 2018-05-23: qty 1

## 2018-05-23 MED ORDER — IBUPROFEN 400 MG PO TABS
400.0000 mg | ORAL_TABLET | Freq: Once | ORAL | Status: AC
  Administered 2018-05-23: 400 mg via ORAL
  Filled 2018-05-23: qty 1

## 2018-05-23 MED ORDER — MELOXICAM 15 MG PO TABS: 15.0000 mg | ORAL_TABLET | Freq: Every day | ORAL | 0 refills | Status: AC

## 2018-05-23 MED ORDER — PREDNISONE 20 MG PO TABS
60.0000 mg | ORAL_TABLET | Freq: Once | ORAL | Status: AC
  Administered 2018-05-23: 60 mg via ORAL
  Filled 2018-05-23: qty 3

## 2018-05-23 MED ORDER — LIDOCAINE 5 % EX PTCH: 1.0000 | MEDICATED_PATCH | CUTANEOUS | 0 refills | Status: DC

## 2018-05-23 MED ORDER — ACETAMINOPHEN 325 MG PO TABS
650.0000 mg | ORAL_TABLET | Freq: Once | ORAL | Status: AC
  Administered 2018-05-23: 650 mg via ORAL
  Filled 2018-05-23: qty 2

## 2018-05-23 NOTE — Discharge Instructions (Signed)
Your hip x-ray shows arthritis and inflammation in your hip.  Please begin steroids tomorrow.  You can take a meloxicam and use a Lidoderm patch today.  Use heat on your hip today.  Please use your walker.  Please make an appointment with primary care for tomorrow or early next week.

## 2018-05-23 NOTE — ED Provider Notes (Signed)
[facility] Emergency Department Provider Note  _____________________________[facility]_______________  Time seen: Approximately 8:41 AM  I have reviewed the triage vital signs and the nursing notes.   HISTORY  Chief Complaint Hip Pain    HPI Holly Lewis is a 84 y.o. female that presents emergency department for evaluation of left hip pain for 2 days.  Patient states that pain is in the front of her hip, "in the joint." Pain is worse when she moves her left leg.  Pain does not radiate.  No trauma.  No nausea, vomiting, abdominal pain, lower leg pain, urinary symptoms.   Past Medical History:  Diagnosis Date  . Anxiety   . Blood transfusion without reported diagnosis   . Cataract   . Chest pain    a. Pt reports nl stress test in 2016 in [place]; b. 05/2017 declined stress test for recurrent c/p.  Marland Kitchen. Hyperlipidemia   . Hypertension   . PAF (paroxysmal atrial fibrillation) (HCC)    a. Dx in 2017 in [place] per pt-->s/p TEE/DCCV;  b. CHA2DS2VASc = 4-->Eliquis.    Patient Active Problem List   Diagnosis Date Noted  . Advanced care planning/counseling discussion 11/14/2017  . DNR (do not resuscitate) 11/14/2017  . Adventitious breath sounds 11/14/2017  . Anxiety 01/09/2017  . Atopic dermatitis 01/09/2017  . History of atrial fibrillation 01/09/2017  . Urge incontinence of urine 01/09/2017  . Essential hypertension, benign 12/12/2016  . Hypercholesteremia 12/12/2016  . Insomnia 12/12/2016  . Abnormal EKG 12/12/2016    Past Surgical History:  Procedure Laterality Date  . CHOLECYSTECTOMY    . EYE SURGERY    . 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 kidney stones      Prior to Admission medications   Medication Sig Start Date End Date Taking? Authorizing Provider  doxepin (SINEQUAN) 10 MG capsule Take 1 capsule (10 mg total) by mouth at bedtime. 12/26/17   Gabriel CirriWicker, Cheryl, NP  lidocaine (LIDODERM) 5 % Place 1 patch onto the  skin daily. Remove & Discard patch within 12 hours or as directed by MD 05/23/18   Enid DerryWagner, Montzerrat Brunell, PA-C  meloxicam (MOBIC) 15 MG tablet Take 1 tablet (15 mg total) by mouth daily for 10 days. 05/23/18 06/02/18  Enid DerryWagner, Doron Shake, PA-C  oxybutynin (DITROPAN-XL) 10 MG 24 hr tablet Take 1 tablet (10 mg total) by mouth at bedtime. 12/26/17   Gabriel CirriWicker, Cheryl, NP  predniSONE (DELTASONE) 10 MG tablet Take 4 tablets (40 mg total) by mouth daily for 5 days. 05/23/18 05/28/18  Enid DerryWagner, Marcia Hartwell, PA-C  Triamcinolone Acetonide (TRIAMCINOLONE 0.1 % CREAM : EUCERIN) CREA Apply 1 application topically 2 (two) times daily as needed for itching. 05/01/18   Aura Dialsannady, Jolene T, NP    Allergies Diltiazem and Latex  Family History  Problem Relation Age of Onset  . Diabetes Mother   . Diabetes Sister   . Cancer Sister        breast  . Alzheimer's disease Sister   . Diabetes Brother   . Cancer Brother        blood    Social History Social History   Tobacco Use  . Smoking status: Never Smoker  . Smokeless tobacco: Never Used  Substance Use Topics  . Alcohol use: No  . Drug use: No     Review of Systems  Cardiovascular: No chest pain. Respiratory: No SOB. Gastrointestinal: No abdominal pain.  No nausea, no vomiting.  Musculoskeletal:  Positive for hip pain. Skin: Negative for rash, abrasions, lacerations, ecchymosis. Neurological: Negative for headaches, numbness or tingling   ____________________________________________   PHYSICAL EXAM:  VITAL SIGNS: ED Triage Vitals  Enc Vitals Group     BP 05/23/18 0653 (!) 146/99     Pulse Rate 05/23/18 0653 96     Resp 05/23/18 0653 20     Temp 05/23/18 0653 97.7 F (36.5 C)     Temp Source 05/23/18 0653 Oral     SpO2 05/23/18 0653 95 %     Weight 05/23/18 0654 185 lb (83.9 kg)     Height 05/23/18 0654 5\' 1"  (1.549 m)     Head Circumference --      Peak Flow --      Pain Score 05/23/18 0653 10     Pain Loc --      Pain Edu? --      Excl. in GC? --       Constitutional: Alert and oriented. Well appearing and in no acute distress. Eyes: Conjunctivae are normal. PERRL. EOMI. Head: Atraumatic. ENT:      Ears:      Nose: No congestion/rhinnorhea.      Mouth/Throat: Mucous membranes are moist.  Neck: No stridor. Cardiovascular: Normal rate, regular rhythm.  Good peripheral circulation. Respiratory: Normal respiratory effort without tachypnea or retractions. Lungs CTAB. Good air entry to the bases with no decreased or absent breath sounds. Gastrointestinal: Bowel sounds 4 quadrants. Soft and nontender to palpation. No guarding or rigidity. No palpable masses. No distention. Musculoskeletal: Full range of motion to all extremities. No gross deformities appreciated.  Tenderness to palpation over anterior hip. No tenderness to palpation over trochanteric bursa.  Pain elicited with range of motion of left hip but full ROM of left hip.  Neurologic:  Normal speech and language. No gross focal neurologic deficits are appreciated.  Skin:  Skin is warm, dry and intact. No rash noted. Psychiatric: Mood and affect are normal. Speech and behavior are normal. Patient exhibits appropriate insight and judgement.   ____________________________________________   LABS (all labs ordered are listed, but only abnormal results are displayed)  Labs Reviewed - No data to display ____________________________________________  EKG   ____________________________________________  RADIOLOGY Lexine Baton, personally viewed and evaluated these images (plain radiographs) as part of my medical decision making, as well as reviewing the written report by the radiologist.  Dg Hip Unilat W Or Wo Pelvis 2-3 Views Left  Result Date: 05/23/2018 CLINICAL DATA:  84 year old female with a history left hip pain EXAM: DG HIP (WITH OR WITHOUT PELVIS) 2-3V LEFT COMPARISON:  None. FINDINGS: Bony pelvic ring intact with no acute fracture identified. Sclerotic changes at the  pubic symphysis. Degenerative changes of the lower lumbar spine. Bilateral hips projects normally over the acetabula. No femoral fracture identified. Degenerative changes of the hips. Vascular calcifications. IMPRESSION: No acute bony abnormality. Osteoarthritis. Evidence of osteitis pubis Electronically Signed   By: Gilmer Mor D.O.   On: 05/23/2018 08:08    ____________________________________________    PROCEDURES  Procedure(s) performed:    Procedures    Medications  acetaminophen (TYLENOL) tablet 650 mg (650 mg Oral Given 05/23/18 0818)  oxyCODONE (Oxy IR/ROXICODONE) immediate release tablet 2.5 mg (2.5 mg Oral Given 05/23/18 0818)  ibuprofen (ADVIL,MOTRIN) tablet 400 mg (400 mg Oral Given 05/23/18 0924)  predniSONE (DELTASONE) tablet 60 mg (60 mg Oral Given 05/23/18 0924)     ____________________________________________   INITIAL IMPRESSION / ASSESSMENT AND PLAN /  ED COURSE  Pertinent labs & imaging results that were available during my care of the patient were reviewed by me and considered in my medical decision making (see chart for details).  Review of the  [place] was performed in accordance of the [place] prior to dispensing any controlled drugs.   Patient presented to the emergency department for evaluation of left hip pain since last night.  Vital signs and exam are reassuring.  X-ray consistent with osteoarthritis and possible osteitis pubis.  Patient was given Tylenol, ibuprofen, a short low dose of Percocet and prednisone.  Patient will begin anti-inflammatories and steroids for inflammation.  Patient will be discharged home with prescriptions for prednisone, mobic, lidoderm. Patient is to follow up with PCP as directed. Patient is given ED precautions to return to the ED for any worsening or new symptoms.     ____________________________________________  FINAL CLINICAL IMPRESSION(S) / ED DIAGNOSES  Final diagnoses:  Left hip pain  Osteoarthritis of both hips,  unspecified osteoarthritis type  Inflammation around joint      NEW MEDICATIONS STARTED DURING THIS VISIT:  ED Discharge Orders         Ordered    predniSONE (DELTASONE) 10 MG tablet  Daily     05/23/18 0909    meloxicam (MOBIC) 15 MG tablet  Daily     05/23/18 0909    lidocaine (LIDODERM) 5 %  Every 24 hours     05/23/18 0909              This chart was dictated using voice recognition software/Dragon. Despite best efforts to proofread, errors can occur which can change the meaning. Any change was purely unintentional.    Enid Derry, PA-C 05/23/18 1550    Governor Rooks, MD 05/23/18 [phone]

## 2018-05-23 NOTE — ED Notes (Signed)
See triage note  Presents with left hip pain  States pain started yesterday  Denies any injury and states pain is non radiating   States she took some tylenol PM last night and did not have any pain when she went to bed  But pain increased this am when she woke up

## 2018-05-23 NOTE — ED Triage Notes (Signed)
Patient to ER for c/o left sided hip pain. Denies any recent falls or injuries. Patient states she is less able to bend over as she ages, believes she may have overdone it bending over. Patient states she is still able to ambulate on hip, that hip just hurts.

## 2018-05-28 ENCOUNTER — Encounter: Payer: Self-pay | Admitting: Nurse Practitioner

## 2018-05-28 ENCOUNTER — Ambulatory Visit (INDEPENDENT_AMBULATORY_CARE_PROVIDER_SITE_OTHER): Payer: Medicare HMO | Admitting: Nurse Practitioner

## 2018-05-28 VITALS — BP 134/70 | HR 69 | Temp 98.5°F | Ht 61.0 in | Wt 189.5 lb

## 2018-05-28 DIAGNOSIS — M1612 Unilateral primary osteoarthritis, left hip: Secondary | ICD-10-CM | POA: Diagnosis not present

## 2018-05-28 DIAGNOSIS — M1990 Unspecified osteoarthritis, unspecified site: Secondary | ICD-10-CM | POA: Insufficient documentation

## 2018-05-28 NOTE — Progress Notes (Signed)
BP 134/70 (BP Location: Left Arm, Patient Position: Sitting)   Pulse 69   Temp 98.5 F (36.9 C)   Ht 5\' 1"  (1.549 m)   Wt 189 lb 8 oz (86 kg)   SpO2 96%   BMI 35.81 kg/m    Subjective:    Patient ID: Holly Lewis, female    DOB: 12/30/1932, 84 y.o.   MRN: [id number]  HPI: Holly Lewis is a 84 y.o. female presents for ER follow-up  Chief Complaint  Patient presents with  . ER Follow Up    Left hip pain, xray showed arthritis   ER FOLLOW UP Presented to hospital for left hip pain with imaging noting osteoarthritis of left hip and osteitis pubis.  She was discharged with Prednisone and Meloxicam.   At this time she reports pain is improved.  Denies pain today.  Reports improvement in mobility.  Educated her on OA and discussed that once medication finished administration she may note discomfort returning.  Discussed referral to ortho for possible steroid injection to hip (she would prefer not to have surgical intervention) and physical therapy referral.  She would prefer to start out with PT referral at this time, no ortho unless pain returns and is not able to tolerate. Time since discharge: Hospital ER 05/23/18 Hospital/facility: [facility] Diagnosis: Left hip osteoarthritis and osteitis pubis Procedures/tests: imaging done Consultants: none New medications: Prednisone and Meloxicam short term Discharge instructions:  F/U PCP Status: stable  Relevant past medical, surgical, family and social history reviewed and updated as indicated. Interim medical history since our last visit reviewed. Allergies and medications reviewed and updated.  Review of Systems  Constitutional: Negative for activity change, appetite change, fatigue and fever.  Respiratory: Negative for cough, chest tightness and shortness of breath.   Cardiovascular: Negative for chest pain, palpitations and leg swelling.  Gastrointestinal: Negative for abdominal distention, abdominal pain, constipation, diarrhea,  nausea and vomiting.  Musculoskeletal: Positive for arthralgias (no pain reported today).  Neurological: Negative for dizziness, syncope, weakness, light-headedness, numbness and headaches.  Psychiatric/Behavioral: Negative.     Per HPI unless specifically indicated above     Objective:    BP 134/70 (BP Location: Left Arm, Patient Position: Sitting)   Pulse 69   Temp 98.5 F (36.9 C)   Ht 5\' 1"  (1.549 m)   Wt 189 lb 8 oz (86 kg)   SpO2 96%   BMI 35.81 kg/m   Wt Readings from Last 3 Encounters:  05/28/18 189 lb 8 oz (86 kg)  05/23/18 185 lb (83.9 kg)  05/01/18 190 lb (86.2 kg)    Physical Exam  Constitutional: She is oriented to person, place, and time. She appears well-developed and well-nourished.  HENT:  Head: Normocephalic.  Eyes: Pupils are equal, round, and reactive to light. Conjunctivae and EOM are normal. Right eye exhibits no discharge. Left eye exhibits no discharge.  Neck: Normal range of motion. Neck supple. No JVD present. Carotid bruit is not present. No thyromegaly present.  Cardiovascular: Normal rate, regular rhythm and normal heart sounds.  Pulmonary/Chest: Effort normal and breath sounds normal.  Abdominal: Soft. Bowel sounds are normal.  Musculoskeletal:       Right hip: She exhibits normal range of motion, normal strength, no tenderness, no bony tenderness and no crepitus.       Left hip: She exhibits normal range of motion, normal strength, no tenderness, no swelling and no crepitus.  No antalgic gait.  Able to walk without support of walking stick.  Lymphadenopathy:    She has no cervical adenopathy.  Neurological: She is alert and oriented to person, place, and time.  Skin: Skin is warm and dry.  Psychiatric: She has a normal mood and affect. Her behavior is normal. Judgment and thought content normal.  Nursing note and vitals reviewed.   Results for orders placed or performed in visit on 05/15/17  Comprehensive metabolic panel  Result Value Ref  Range   Glucose 84 65 - 99 mg/dL   BUN 14 8 - 27 mg/dL   Creatinine, Ser 4.780.74 0.57 - 1.00 mg/dL   GFR calc non Af Amer 76 >59 mL/min/1.73   GFR calc Af Amer 87 >59 mL/min/1.73   BUN/Creatinine Ratio 19 12 - 28   Sodium 138 134 - 144 mmol/L   Potassium 4.6 3.5 - 5.2 mmol/L   Chloride 104 96 - 106 mmol/L   CO2 21 20 - 29 mmol/L   Calcium 9.3 8.7 - 10.3 mg/dL   Total Protein 7.4 6.0 - 8.5 g/dL   Albumin 4.3 3.5 - 4.7 g/dL   Globulin, Total 3.1 1.5 - 4.5 g/dL   Albumin/Globulin Ratio 1.4 1.2 - 2.2   Bilirubin Total 0.3 0.0 - 1.2 mg/dL   Alkaline Phosphatase 83 39 - 117 IU/L   AST 23 0 - 40 IU/L   ALT 16 0 - 32 IU/L  Lipid Panel w/o Chol/HDL Ratio  Result Value Ref Range   Cholesterol, Total 199 100 - 199 mg/dL   Triglycerides 295101 0 - 149 mg/dL   HDL 58 >62>39 mg/dL   VLDL Cholesterol Cal 20 5 - 40 mg/dL   LDL Calculated 130121 (H) 0 - 99 mg/dL  Alpha Gal IgE  Result Value Ref Range   Alpha Gal IgE* <0.10 <0.10 kU/L      Assessment & Plan:   Problem List Items Addressed This Visit      Musculoskeletal and Integument   Primary osteoarthritis of left hip - Primary    Physical therapy consult placed.  Will consider ortho consult if increase pain noted.  Recommended use of Tylenol 650 MG Q6H as needed for pain vs use of NSAID.  Printed out chair stretches and exercises for patient to perform at home.  Weight loss recommended.      Relevant Orders   Ambulatory referral to Physical Therapy       Follow up plan: Return in about 2 months (around 07/29/2018).

## 2018-05-28 NOTE — Assessment & Plan Note (Signed)
Physical therapy consult placed.  Will consider ortho consult if increase pain noted.  Recommended use of Tylenol 650 MG Q6H as needed for pain vs use of NSAID.  Printed out chair stretches and exercises for patient to perform at home.  Weight loss recommended.

## 2018-05-28 NOTE — Patient Instructions (Signed)

## 2018-07-26 ENCOUNTER — Ambulatory Visit: Payer: Self-pay

## 2018-07-29 ENCOUNTER — Ambulatory Visit (INDEPENDENT_AMBULATORY_CARE_PROVIDER_SITE_OTHER): Payer: Medicare HMO | Admitting: Nurse Practitioner

## 2018-07-29 ENCOUNTER — Other Ambulatory Visit: Payer: Self-pay

## 2018-07-29 ENCOUNTER — Encounter: Payer: Self-pay | Admitting: Nurse Practitioner

## 2018-07-29 ENCOUNTER — Ambulatory Visit (INDEPENDENT_AMBULATORY_CARE_PROVIDER_SITE_OTHER): Payer: Medicare HMO

## 2018-07-29 VITALS — BP 122/88 | HR 78 | Temp 97.8°F | Resp 18 | Ht 61.0 in | Wt 194.0 lb

## 2018-07-29 DIAGNOSIS — I10 Essential (primary) hypertension: Secondary | ICD-10-CM

## 2018-07-29 DIAGNOSIS — M1612 Unilateral primary osteoarthritis, left hip: Secondary | ICD-10-CM

## 2018-07-29 DIAGNOSIS — N3941 Urge incontinence: Secondary | ICD-10-CM | POA: Diagnosis not present

## 2018-07-29 DIAGNOSIS — Z8679 Personal history of other diseases of the circulatory system: Secondary | ICD-10-CM | POA: Diagnosis not present

## 2018-07-29 DIAGNOSIS — E7800 Pure hypercholesterolemia, unspecified: Secondary | ICD-10-CM | POA: Diagnosis not present

## 2018-07-29 DIAGNOSIS — Z0000 Encounter for general adult medical examination without abnormal findings: Secondary | ICD-10-CM | POA: Diagnosis not present

## 2018-07-29 MED ORDER — OXYBUTYNIN CHLORIDE ER 15 MG PO TB24: 15.0000 mg | ORAL_TABLET | Freq: Every day | ORAL | 5 refills | Status: DC

## 2018-07-29 NOTE — Patient Instructions (Signed)
Holly Lewis , Thank you for taking time to come for your Medicare Wellness Visit. I appreciate your ongoing commitment to your health goals. Please review the following plan we discussed and let me know if I can assist you in the future.   Screening recommendations/referrals: Colonoscopy: no longer required Mammogram: no longer required  Bone Density: completed 07/01/2015 Recommended yearly ophthalmology/optometry visit for glaucoma screening and checkup Recommended yearly dental visit for hygiene and checkup  Vaccinations: Influenza vaccine: up to date Pneumococcal vaccine: up to date Tdap vaccine: due, check with your insurance company for coverage  Shingles vaccine: Shingrix eligible, check with your insurance company for coverage   Advanced directives: Please bring a copy of your health care power of attorney and living will to the office at your convenience.  Conditions/risks identified: none   Next appointment: Follow up in one year for your annual wellness exam.    Preventive Care 65 Years and Older, Female Preventive care refers to lifestyle choices and visits with your health care provider that can promote health and wellness. What does preventive care include?  A yearly physical exam. This is also called an annual well check.  Dental exams once or twice a year.  Routine eye exams. Ask your health care provider how often you should have your eyes checked.  Personal lifestyle choices, including:  Daily care of your teeth and gums.  Regular physical activity.  Eating a healthy diet.  Avoiding tobacco and drug use.  Limiting alcohol use.  Practicing safe sex.  Taking low-dose aspirin every day.  Taking vitamin and mineral supplements as recommended by your health care provider. What happens during an annual well check? The services and screenings done by your health care provider during your annual well check will depend on your age, overall health,  lifestyle risk factors, and family history of disease. Counseling  Your health care provider may ask you questions about your:  Alcohol use.  Tobacco use.  Drug use.  Emotional well-being.  Home and relationship well-being.  Sexual activity.  Eating habits.  History of falls.  Memory and ability to understand (cognition).  Work and work Astronomer.  Reproductive health. Screening  You may have the following tests or measurements:  Height, weight, and BMI.  Blood pressure.  Lipid and cholesterol levels. These may be checked every 5 years, or more frequently if you are over 27 years old.  Skin check.  Lung cancer screening. You may have this screening every year starting at age 39 if you have a 30-pack-year history of smoking and currently smoke or have quit within the past 15 years.  Fecal occult blood test (FOBT) of the stool. You may have this test every year starting at age 4.  Flexible sigmoidoscopy or colonoscopy. You may have a sigmoidoscopy every 5 years or a colonoscopy every 10 years starting at age 83.  Hepatitis C blood test.  Hepatitis B blood test.  Sexually transmitted disease (STD) testing.  Diabetes screening. This is done by checking your blood sugar (glucose) after you have not eaten for a while (fasting). You may have this done every 1-3 years.  Bone density scan. This is done to screen for osteoporosis. You may have this done starting at age 32.  Mammogram. This may be done every 1-2 years. Talk to your health care provider about how often you should have regular mammograms. Talk with your health care provider about your test results, treatment options, and if necessary, the need for more tests.  Vaccines  Your health care provider may recommend certain vaccines, such as:  Influenza vaccine. This is recommended every year.  Tetanus, diphtheria, and acellular pertussis (Tdap, Td) vaccine. You may need a Td booster every 10 years.  Zoster  vaccine. You may need this after age 24.  Pneumococcal 13-valent conjugate (PCV13) vaccine. One dose is recommended after age 47.  Pneumococcal polysaccharide (PPSV23) vaccine. One dose is recommended after age 37. Talk to your health care provider about which screenings and vaccines you need and how often you need them. This information is not intended to replace advice given to you by your health care provider. Make sure you discuss any questions you have with your health care provider. Document Released: 07/02/2015 Document Revised: 02/23/2016 Document Reviewed: 04/06/2015 Elsevier Interactive Patient Education  2017 [place] Prevention in the Home Falls can cause injuries. They can happen to people of all ages. There are many things you can do to make your home safe and to help prevent falls. What can I do on the outside of my home?  Regularly fix the edges of walkways and driveways and fix any cracks.  Remove anything that might make you trip as you walk through a door, such as a raised step or threshold.  Trim any bushes or trees on the path to your home.  Use bright outdoor lighting.  Clear any walking paths of anything that might make someone trip, such as rocks or tools.  Regularly check to see if handrails are loose or broken. Make sure that both sides of any steps have handrails.  Any raised decks and porches should have guardrails on the edges.  Have any leaves, snow, or ice cleared regularly.  Use sand or salt on walking paths during winter.  Clean up any spills in your garage right away. This includes oil or grease spills. What can I do in the bathroom?  Use night lights.  Install grab bars by the toilet and in the tub and shower. Do not use towel bars as grab bars.  Use non-skid mats or decals in the tub or shower.  If you need to sit down in the shower, use a plastic, non-slip stool.  Keep the floor dry. Clean up any water that spills on the  floor as soon as it happens.  Remove soap buildup in the tub or shower regularly.  Attach bath mats securely with double-sided non-slip rug tape.  Do not have throw rugs and other things on the floor that can make you trip. What can I do in the bedroom?  Use night lights.  Make sure that you have a light by your bed that is easy to reach.  Do not use any sheets or blankets that are too big for your bed. They should not hang down onto the floor.  Have a firm chair that has side arms. You can use this for support while you get dressed.  Do not have throw rugs and other things on the floor that can make you trip. What can I do in the kitchen?  Clean up any spills right away.  Avoid walking on wet floors.  Keep items that you use a lot in easy-to-reach places.  If you need to reach something above you, use a strong step stool that has a grab bar.  Keep electrical cords out of the way.  Do not use floor polish or wax that makes floors slippery. If you must use wax, use non-skid floor wax.  Do not have throw rugs and other things on the floor that can make you trip. What can I do with my stairs?  Do not leave any items on the stairs.  Make sure that there are handrails on both sides of the stairs and use them. Fix handrails that are broken or loose. Make sure that handrails are as long as the stairways.  Check any carpeting to make sure that it is firmly attached to the stairs. Fix any carpet that is loose or worn.  Avoid having throw rugs at the top or bottom of the stairs. If you do have throw rugs, attach them to the floor with carpet tape.  Make sure that you have a light switch at the top of the stairs and the bottom of the stairs. If you do not have them, ask someone to add them for you. What else can I do to help prevent falls?  Wear shoes that:  Do not have high heels.  Have rubber bottoms.  Are comfortable and fit you well.  Are closed at the toe. Do not wear  sandals.  If you use a stepladder:  Make sure that it is fully opened. Do not climb a closed stepladder.  Make sure that both sides of the stepladder are locked into place.  Ask someone to hold it for you, if possible.  Clearly mark and make sure that you can see:  Any grab bars or handrails.  First and last steps.  Where the edge of each step is.  Use tools that help you move around (mobility aids) if they are needed. These include:  Canes.  Walkers.  Scooters.  Crutches.  Turn on the lights when you go into a dark area. Replace any light bulbs as soon as they burn out.  Set up your furniture so you have a clear path. Avoid moving your furniture around.  If any of your floors are uneven, fix them.  If there are any pets around you, be aware of where they are.  Review your medicines with your doctor. Some medicines can make you feel dizzy. This can increase your chance of falling. Ask your doctor what other things that you can do to help prevent falls. This information is not intended to replace advice given to you by your health care provider. Make sure you discuss any questions you have with your health care provider. Document Released: 04/01/2009 Document Revised: 11/11/2015 Document Reviewed: 07/10/2014 Elsevier Interactive Patient Education  2017 [employer].

## 2018-07-29 NOTE — Progress Notes (Signed)
BP 122/88   Pulse 78   Temp 97.8 F (36.6 C) (Oral)   Resp 18   Ht 5' (1.524 m)   Wt 194 lb (88 kg)   SpO2 95%   BMI 37.89 kg/m    Subjective:    Patient ID: Holly Lewis, female    DOB: 07/24/1932, 85 y.o.   MRN: [id number]  HPI: Holly Lewis is a 85 y.o. female  Chief Complaint  Patient presents with  . Follow-up    38m   HYPERTENSION / HYPERLIPIDEMIA/A-Fib She refuses medication for her hyperlipidemia, blood pressure, or atrial fibrillation.  Has lengthy conversation with her today, about routine lab work.  She is a DNR and reports "when it is my time it is my time, I have lived many years".  She agrees with no longer checking her cholesterol panel, as she does not wish to take medications and wishes to eat her normal daily diet.  Reports he focus is more on quality of life vs quantity.  Will obtain other labs as need, such a CBC, BMP, and thyroid panel.  Does not wish to have labs obtained today. Satisfied with current treatment? No current treatment Duration of hypertension: chronic BP monitoring frequency: daily BP range: 110-120/80 Duration of hyperlipidemia: chronic Aspirin: no Recent stressors: no Recurrent headaches: no Visual changes: no Palpitations: no Dyspnea: no Chest pain: no Lower extremity edema: no Dizzy/lightheaded: no   OSTEOARTHRITIS: She reports improvement in hip pain.  In December was treated Prednisone and Meloxicam and reports this improvement.  She reports there are days "all joints hurt, but then it goes away".    URGE INCONTINENCE OF URINE: Has been on Ditropan XL 10 MG for one year.  Reports it worked for [employer], but states today that she is having increased episodes of incontinence.   She denies dysuria, hematuria, or abdominal pain.  Endorses urgency, but no frequency.  At baseline she is overweight.  Will trial increase in medication.  Discussed risks/benefits of medication and at this time she wishes to continue taking on daily  basis and trial increase in dose, as benefit is felt to outweigh risk.  She denies constipation, diarrhea, vision changes, or recent falls at home.  Relevant past medical, surgical, family and social history reviewed and updated as indicated. Interim medical history since our last visit reviewed. Allergies and medications reviewed and updated.  Review of Systems  Constitutional: Negative for activity change, appetite change, diaphoresis, fatigue and fever.  Respiratory: Negative for cough, chest tightness and shortness of breath.   Cardiovascular: Negative for chest pain, palpitations and leg swelling.  Gastrointestinal: Negative for abdominal distention, abdominal pain, constipation, diarrhea, nausea and vomiting.  Endocrine: Negative for cold intolerance, heat intolerance, polydipsia, polyphagia and polyuria.  Genitourinary: Positive for urgency. Negative for decreased urine volume, difficulty urinating, dysuria, flank pain, frequency, hematuria and vaginal pain.  Neurological: Negative for dizziness, syncope, weakness, light-headedness, numbness and headaches.  Psychiatric/Behavioral: Negative.     Per HPI unless specifically indicated above     Objective:    BP 122/88   Pulse 78   Temp 97.8 F (36.6 C) (Oral)   Resp 18   Ht 5' (1.524 m)   Wt 194 lb (88 kg)   SpO2 95%   BMI 37.89 kg/m   Wt Readings from Last 3 Encounters:  07/29/18 194 lb (88 kg)  07/29/18 194 lb (88 kg)  05/28/18 189 lb 8 oz (86 kg)    Physical Exam Vitals signs and nursing  note reviewed.  Constitutional:      General: She is awake.     Appearance: She is well-developed.  HENT:     Head: Normocephalic.     Right Ear: Hearing normal.     Left Ear: Hearing normal.     Nose: Nose normal.     Mouth/Throat:     Mouth: Mucous membranes are moist.  Eyes:     General: Lids are normal.        Right eye: No discharge.        Left eye: No discharge.     Conjunctiva/sclera: Conjunctivae normal.      Pupils: Pupils are equal, round, and reactive to light.     Comments: Arcus senilis bilaterally.  Neck:     Musculoskeletal: Normal range of motion and neck supple.     Thyroid: No thyromegaly.     Vascular: No carotid bruit or JVD.  Cardiovascular:     Rate and Rhythm: Normal rate and regular rhythm.     Heart sounds: Normal heart sounds. No murmur. No gallop.   Pulmonary:     Effort: Pulmonary effort is normal.     Breath sounds: Normal breath sounds.  Abdominal:     General: Bowel sounds are normal.     Palpations: Abdomen is soft. There is no hepatomegaly or splenomegaly.     Tenderness: There is no abdominal tenderness. There is no right CVA tenderness or left CVA tenderness.  Musculoskeletal:     Right lower leg: No edema.     Left lower leg: No edema.  Lymphadenopathy:     Cervical: No cervical adenopathy.  Skin:    General: Skin is warm and dry.  Neurological:     Mental Status: She is alert and oriented to person, place, and time.  Psychiatric:        Attention and Perception: Attention normal.        Mood and Affect: Mood normal.        Speech: Speech normal.        Behavior: Behavior normal. Behavior is cooperative.        Thought Content: Thought content normal.        Cognition and Memory: Cognition normal.        Judgment: Judgment normal.     Results for orders placed or performed in visit on 05/15/17  Comprehensive metabolic panel  Result Value Ref Range   Glucose 84 65 - 99 mg/dL   BUN 14 8 - 27 mg/dL   Creatinine, Ser 4.090.74 0.57 - 1.00 mg/dL   GFR calc non Af Amer 76 >59 mL/min/1.73   GFR calc Af Amer 87 >59 mL/min/1.73   BUN/Creatinine Ratio 19 12 - 28   Sodium 138 134 - 144 mmol/L   Potassium 4.6 3.5 - 5.2 mmol/L   Chloride 104 96 - 106 mmol/L   CO2 21 20 - 29 mmol/L   Calcium 9.3 8.7 - 10.3 mg/dL   Total Protein 7.4 6.0 - 8.5 g/dL   Albumin 4.3 3.5 - 4.7 g/dL   Globulin, Total 3.1 1.5 - 4.5 g/dL   Albumin/Globulin Ratio 1.4 1.2 - 2.2    Bilirubin Total 0.3 0.0 - 1.2 mg/dL   Alkaline Phosphatase 83 39 - 117 IU/L   AST 23 0 - 40 IU/L   ALT 16 0 - 32 IU/L  Lipid Panel w/o Chol/HDL Ratio  Result Value Ref Range   Cholesterol, Total 199 100 - 199 mg/dL   Triglycerides  101 0 - 149 mg/dL   HDL 58 >26 mg/dL   VLDL Cholesterol Cal 20 5 - 40 mg/dL   LDL Calculated 712 (H) 0 - 99 mg/dL  Alpha Gal IgE  Result Value Ref Range   Alpha Gal IgE* <0.10 <0.10 kU/L      Assessment & Plan:   Problem List Items Addressed This Visit      Cardiovascular and Mediastinum   Essential hypertension, benign    Chronic, well controlled without medications.  She decline treatment.  Continue to collaborate with cardiology.        Other   Hypercholesteremia    Chronic, declines statin therapy.  No further lipid checks unless requested by patient.      History of atrial fibrillation    Last seen by cardiology September 2019, continues to refuse oral anticoagulant and medications for rate control.  Reports understanding of risks and at this time wishes to focus on quality of life.      Urge incontinence of urine    Chronic, ongoing.  Reports increase in symptoms.  Will trial increase to 15 MG.  Full discussed risks/benefits of medication with patient, patient wishes to trial increase.  Return in 3 months for reassessment.      Relevant Medications   oxybutynin (DITROPAN XL) 15 MG 24 hr tablet       Follow up plan: Return in about 3 months (around 10/27/2018) for urge incontinence.

## 2018-07-29 NOTE — Progress Notes (Signed)
Subjective:   Holly Lewis is a 85 y.o. female who presents for Medicare Annual (Subsequent) preventive examination.  Review of Systems:   Cardiac Risk Factors include: advanced age (>3555men, 39>65 women);hypertension;dyslipidemia;obesity (BMI >30kg/m2)     Objective:     Vitals: BP 122/88 (BP Location: Left Arm, Patient Position: Sitting, Cuff Size: Normal)   Pulse 78   Temp 97.8 F (36.6 C) (Temporal)   Resp 18   Ht 5\' 1"  (1.549 m)   Wt 194 lb (88 kg)   BMI 36.66 kg/m   Body mass index is 36.66 kg/m.  Advanced Directives 07/29/2018 05/23/2018 07/25/2017  Does Patient Have a Medical Advance Directive? No Yes Yes  Type of Advance Directive - Living will;Healthcare Power of [employer] Healthcare Power of [place];Living will  Does patient want to make changes to medical advance directive? - No - Patient declined -  Copy of Healthcare Power of Attorney in Chart? - No - copy requested Yes  Would patient like information on creating a medical advance directive? No - Patient declined - -    Tobacco Social History   Tobacco Use  Smoking Status Never Smoker  Smokeless Tobacco Never Used     Counseling given: Not Answered   Clinical Intake:  Pre-visit preparation completed: Yes  Pain : 0-10 Pain Score: 5  Pain Type: Chronic pain Pain Location: ("everywhere") Pain Descriptors / Indicators: Aching Pain Onset: More than a month ago Pain Frequency: Constant     Nutritional Status: BMI > 30  Obese Nutritional Risks: None Diabetes: No  How often do you need to have someone help you when you read instructions, pamphlets, or other written materials from your doctor or pharmacy?: 1 - Never What is the last grade level you completed in school?: bachelors degree  Interpreter Needed?: No  Information entered by :: Jenille Laszlo,LPN   Past Medical History:  Diagnosis Date  . Anxiety   . Blood transfusion without reported diagnosis   . Cataract   . Chest pain    a. Pt  reports nl stress test in 2016 in [place]; b. 05/2017 declined stress test for recurrent c/p.  Marland Kitchen. Hyperlipidemia   . Hypertension   . PAF (paroxysmal atrial fibrillation) (HCC)    a. Dx in 2017 in [place] per pt-->s/p TEE/DCCV;  b. CHA2DS2VASc = 4-->Eliquis.   Past Surgical History:  Procedure Laterality Date  . CHOLECYSTECTOMY    . EYE SURGERY    . 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 kidney stones     Family History  Problem Relation Age of Onset  . Diabetes Mother   . Diabetes Sister   . Cancer Sister        breast  . Alzheimer's disease Sister   . Diabetes Brother   . Cancer Brother        blood   Social History   Socioeconomic History  . Marital status: Widowed    Spouse name: Not on file  . Number of children: Not on file  . Years of education: Not on file  . Highest education level: Bachelor's degree (e.g., BA, AB, BS)  Occupational History  . Occupation: retired  Engineer, productionocial Needs  . Financial resource strain: Not very hard  . Food insecurity:    Worry: Never true    Inability: Never true  . Transportation needs:    Medical: No    Non-medical: No  Tobacco Use  .  Smoking status: Never Smoker  . Smokeless tobacco: Never Used  Substance and Sexual Activity  . Alcohol use: No  . Drug use: No  . Sexual activity: Never  Lifestyle  . Physical activity:    Days per week: 0 days    Minutes per session: 0 min  . Stress: Not at all  Relationships  . Social connections:    Talks on phone: More than three times a week    Gets together: Once a week    Attends religious service: Never    Active member of club or organization: No    Attends meetings of clubs or organizations: Never    Relationship status: Widowed  Other Topics Concern  . Not on file  Social History Narrative  . Not on file    Outpatient Encounter Medications as of 07/29/2018  Medication Sig  . doxepin (SINEQUAN) 10 MG capsule Take 1 capsule (10 mg total) by mouth  at bedtime.  Marland Kitchen oxybutynin (DITROPAN-XL) 10 MG 24 hr tablet Take 1 tablet (10 mg total) by mouth at bedtime.  . lidocaine (LIDODERM) 5 % Place 1 patch onto the skin daily. Remove & Discard patch within 12 hours or as directed by MD (Patient not taking: Reported on 07/29/2018)  . Triamcinolone Acetonide (TRIAMCINOLONE 0.1 % CREAM : EUCERIN) CREA Apply 1 application topically 2 (two) times daily as needed for itching. (Patient not taking: Reported on 07/29/2018)   No facility-administered encounter medications on file as of 07/29/2018.     Activities of Daily Living In your present state of health, do you have any difficulty performing the following activities: 07/29/2018  Hearing? N  Comment declines hearing aids   Vision? N  Comment declines eye doctor, wears glasses  Difficulty concentrating or making decisions? N  Walking or climbing stairs? N  Dressing or bathing? N  Doing errands, shopping? Y  Comment has transportation services and son helps when needed  Preparing Food and eating ? N  Using the Toilet? N  In the past six months, have you accidently leaked urine? Y  Comment wears pads  Do you have problems with loss of bowel control? N  Managing your Medications? N  Managing your Finances? N  Housekeeping or managing your Housekeeping? N  Some recent data might be hidden    Patient Care Team: Marjie Skiff, NP as PCP - General (Nurse Practitioner) Iran Ouch, MD as PCP - Cardiology (Cardiology) [place] Ouch, MD as Consulting Physician (Cardiology)    Assessment:   This is a routine wellness examination for Britany.  Exercise Activities and Dietary recommendations Current Exercise Habits: Home exercise routine, Type of exercise: walking, Time (Minutes): 10, Frequency (Times/Week): 7, Weekly Exercise (Minutes/Week): 70, Intensity: Mild, Exercise limited by: None identified  Goals    . DIET - INCREASE WATER INTAKE     Recommend drinking at least 6-8 glasses of  water a day        Fall Risk Fall Risk  07/29/2018 07/25/2017  Falls in the past year? 0 Yes  Number falls in past yr: 0 1  Injury with Fall? 0 No   FALL RISK PREVENTION PERTAINING TO THE HOME:  Any stairs in or around the home WITH handrails? No stairs  Home free of loose throw rugs in walkways, pet beds, electrical cords, etc? Yes  Adequate lighting in your home to reduce risk of falls? Yes   ASSISTIVE DEVICES UTILIZED TO PREVENT FALLS:  Life alert? No  Use of a  cane, walker or w/c? Yes  Grab bars in the bathroom? Yes  Shower chair or bench in shower? Yes  Elevated toilet seat or a handicapped toilet? No   DME ORDERS:  DME order needed?  No   TIMED UP AND GO:  Was the test performed? Yes .  Length of time to ambulate 10 feet: 12 sec.   GAIT:  Appearance of gait: Gait steady and slow with the use of an assistive device.  Education: Fall risk prevention has been discussed.  Intervention(s) required? No    Depression Screen PHQ 2/9 Scores 07/29/2018 11/14/2017 07/25/2017 02/09/2017  PHQ - 2 Score 1 0 0 0  PHQ- 9 Score - 3 - 2     Cognitive Function     6CIT Screen 07/29/2018 07/25/2017  What Year? 0 points 0 points  What month? 0 points 0 points  What time? 0 points 0 points  Count back from 20 0 points 0 points  Months in reverse 0 points 0 points  Repeat phrase 2 points 0 points  Total Score 2 0    Immunization History  Administered Date(s) Administered  . Influenza, High Dose Seasonal PF 04/02/2017, 05/01/2018  . Pneumococcal Conjugate-13 11/18/2014  . Pneumococcal Polysaccharide-23 06/19/2006    Qualifies for Shingles Vaccine? Yes   Due for Shingrix. Education has been provided regarding the importance of this vaccine. Pt has been advised to call insurance company to determine out of pocket expense. Advised may also receive vaccine at local pharmacy or Health Dept. Verbalized acceptance and understanding.  Tdap: Although this vaccine is not a covered  service during a Wellness Exam, does the patient still wish to receive this vaccine today?  No .  Education has been provided regarding the importance of this vaccine. Advised may receive this vaccine at local pharmacy or Health Dept. Aware to provide a copy of the vaccination record if obtained from local pharmacy or Health Dept. Verbalized acceptance and understanding.  Flu Vaccine: up to date   Pneumococcal Vaccine: up to date   Screening Tests Health Maintenance  Topic Date Due  . TETANUS/TDAP  08/13/1951  . INFLUENZA VACCINE  Completed  . DEXA SCAN  Completed  . PNA vac Low Risk Adult  Completed    Cancer Screenings:  Colorectal Screening: no longer required  Mammogram: no longer required  Bone Density: Completed 07/01/2015  Lung Cancer Screening: (Low Dose CT Chest recommended if Age 81-80 years, 30 pack-year currently smoking OR have quit w/in 15years.) does not qualify.    Additional Screening:  Hepatitis C Screening: does not qualify  Vision Screening: Recommended annual ophthalmology exams for early detection of glaucoma and other disorders of the eye. Is the patient up to date with their annual eye exam?  No  Who is the provider or what is the name of the office in which the pt attends annual eye exams? n/a If pt is not established with a provider, would they like to be referred to a provider to establish care? No . Ophthalmology referral has been placed. Pt aware the office will call re: appt.  Dental Screening: Recommended annual dental exams for proper oral hygiene  Community Resource Referral:  CRR required this visit?  No      Plan:    I have personally reviewed and addressed the Medicare Annual Wellness questionnaire and have noted the following in the patient's chart:  A. Medical and social history B. Use of alcohol, tobacco or illicit drugs  C. Current medications  and supplements D. Functional ability and status E.  Nutritional status F.  Physical  activity G. Advance directives H. List of other physicians I.  Hospitalizations, surgeries, and ER visits in previous 12 months J.  Vitals K. Screenings such as hearing and vision if needed, cognitive and depression L. Referrals and appointments   In addition, I have reviewed and discussed with patient certain preventive protocols, quality metrics, and best practice recommendations. A written personalized care plan for preventive services as well as general preventive health recommendations were provided to patient.   Signed,  Marin Roberts, LPN Nurse Health Advisor   Nurse Notes:none

## 2018-07-29 NOTE — Assessment & Plan Note (Signed)
Chronic, ongoing.  Reports increase in symptoms.  Will trial increase to 15 MG.  Full discussed risks/benefits of medication with patient, patient wishes to trial increase.  Return in 3 months for reassessment.

## 2018-07-29 NOTE — Assessment & Plan Note (Signed)
Chronic, well controlled without medications.  She decline treatment.  Continue to collaborate with cardiology.

## 2018-07-29 NOTE — Assessment & Plan Note (Signed)
Reports improvement in pain.  Continue to monitor.

## 2018-07-29 NOTE — Patient Instructions (Signed)

## 2018-07-29 NOTE — Assessment & Plan Note (Signed)
Chronic, declines statin therapy.  No further lipid checks unless requested by patient.

## 2018-07-29 NOTE — Assessment & Plan Note (Signed)
Last seen by cardiology September 2019, continues to refuse oral anticoagulant and medications for rate control.  Reports understanding of risks and at this time wishes to focus on quality of life.

## 2018-08-08 ENCOUNTER — Other Ambulatory Visit: Payer: Self-pay | Admitting: Unknown Physician Specialty

## 2018-08-08 NOTE — Telephone Encounter (Signed)
Patient has appointment 11/01/18 Requested Prescriptions  Pending Prescriptions Disp Refills  . doxepin (SINEQUAN) 10 MG capsule [Pharmacy Med Name: DOXEPIN 10MG  CAPSULES] 30 capsule 3    Sig: TAKE ONE CAPSULE BY MOUTH AT BEDTIME     Psychiatry:  Antidepressants - Heterocyclics (TCAs) Passed - 08/08/2018  6:47 AM      Passed - Valid encounter within last 6 months    Recent Outpatient Visits          1 week ago Essential hypertension, benign   [facility] [place], Klein T, NP   2 months ago Primary osteoarthritis of left hip   [facility] [place], Osgood T, NP   3 months ago Flu vaccine need   [facility], Corrie Dandy T, NP   7 months ago Urticaria   [facility] Gabriel Cirri, NP   8 months ago Advanced care planning/counseling discussion   [facility] Gabriel Cirri, NP      Future Appointments            In 2 months Cannady, Dorie Rank, NP [facility]   In 11 months  [facility]

## 2018-09-13 ENCOUNTER — Other Ambulatory Visit: Payer: Self-pay | Admitting: Nurse Practitioner

## 2018-09-13 ENCOUNTER — Telehealth: Payer: Self-pay | Admitting: Nurse Practitioner

## 2018-09-13 MED ORDER — MELOXICAM 15 MG PO TABS: 7.5000 mg | ORAL_TABLET | Freq: Every day | ORAL | 0 refills | Status: AC

## 2018-09-13 MED ORDER — PREDNISONE 20 MG PO TABS: 40.0000 mg | ORAL_TABLET | Freq: Every day | ORAL | 0 refills | Status: AC

## 2018-09-13 NOTE — Telephone Encounter (Signed)
I have sent for short period only.  Tell her please not to take both medications together, but to take separately at least a couple hours apart.  Thanks.  IF worsening pain tell her I will need to see her, which we can do face to face or via video if needed.

## 2018-09-13 NOTE — Telephone Encounter (Signed)
Copied from CRM [id number]. Topic: Quick Communication - Rx Refill/Question >> Sep 13, 2018  9:08 AM Maia Petties wrote: Medication: predniSONE (DELTASONE) 10 MG tablet meloxicam (MOBIC) 15 MG tablet   Has the patient contacted their pharmacy? No - no refills - pt was seen in December for left hip pain. Pt having the same pain in right hip and requesting medication refills Preferred Pharmacy (with phone number or street name): [facility][id number] [place] D[place], [place] - [address] AT [facility] [address] [phone] (Phone) [phone] (Fax)

## 2018-09-13 NOTE — Telephone Encounter (Signed)
Patient notified and verbalized understanding of Jolene's message.  

## 2018-09-13 NOTE — Progress Notes (Signed)
Patient reporting right hip pain and request refill on Prednisone and Meloxicam, which worked during last episode of pain.  Refills sent per request, if worsening pain will have patient come into office.  At this time with COVID precautions will trial medication first.

## 2018-09-21 ENCOUNTER — Other Ambulatory Visit: Payer: Self-pay | Admitting: Unknown Physician Specialty

## 2018-10-28 ENCOUNTER — Other Ambulatory Visit: Payer: Self-pay | Admitting: Nurse Practitioner

## 2018-10-28 ENCOUNTER — Telehealth: Payer: Self-pay | Admitting: Nurse Practitioner

## 2018-10-28 MED ORDER — MELOXICAM 15 MG PO TABS: 7.5000 mg | ORAL_TABLET | Freq: Every day | ORAL | 0 refills | Status: DC

## 2018-10-28 MED ORDER — PREDNISONE 20 MG PO TABS: 40.0000 mg | ORAL_TABLET | Freq: Every day | ORAL | 0 refills | Status: DC

## 2018-10-28 NOTE — Telephone Encounter (Unsigned)
Copied from CRM [id number]. Topic: Quick Communication - Rx Refill/Question >> Oct 28, 2018  9:46 AM Marylen Ponto wrote: Medication: meloxicam (MOBIC) 15 MG tablet and predniSONE (DELTASONE) 10 MG tablet  Has the patient contacted their pharmacy? no  Preferred Pharmacy (with phone number or street name): [facility] #[id number] [place]ree Ditto, [place] - [address] AT [facility] OF [address] [phone] (Phone)  [phone] (Fax)  Agent: Please be advised that RX refills may take up to 3 business days. We ask that you follow-up with your pharmacy.

## 2018-10-28 NOTE — Progress Notes (Signed)
Patient requesting refill on Meloxicam and Prednisone.  Previously given this in ER 05/23/18 for left hip pain, which improved discomfort.  Will give short burst only due to age.  Patient sees provider 11/01/2018, will further discuss with her at that visit and discuss alternate options.

## 2018-10-28 NOTE — Telephone Encounter (Signed)
Sent to pharmacy 

## 2018-11-01 ENCOUNTER — Other Ambulatory Visit: Payer: Self-pay

## 2018-11-01 ENCOUNTER — Encounter: Payer: Self-pay | Admitting: Nurse Practitioner

## 2018-11-01 ENCOUNTER — Ambulatory Visit (INDEPENDENT_AMBULATORY_CARE_PROVIDER_SITE_OTHER): Payer: Medicare HMO | Admitting: Nurse Practitioner

## 2018-11-01 VITALS — BP 131/84 | HR 90 | Temp 99.0°F | Ht 61.0 in | Wt 192.0 lb

## 2018-11-01 DIAGNOSIS — N3941 Urge incontinence: Secondary | ICD-10-CM | POA: Diagnosis not present

## 2018-11-01 DIAGNOSIS — M150 Primary generalized (osteo)arthritis: Secondary | ICD-10-CM

## 2018-11-01 DIAGNOSIS — M159 Polyosteoarthritis, unspecified: Secondary | ICD-10-CM

## 2018-11-01 NOTE — Progress Notes (Signed)
BP 131/84   Pulse 90   Temp 99 F (37.2 C) (Oral)   Ht 5\' 1"  (1.549 m)   Wt 192 lb (87.1 kg)   SpO2 94%   BMI 36.28 kg/m    Subjective:    Patient ID: Holly PetersAlma Lewis, female    DOB: Dec 13, 1932, 85 y.o.   MRN: [id number]  HPI: Holly Lewis is a 85 y.o. female  Chief Complaint  Patient presents with  . Urinary Incontinence    3761m f/u  . Hypertension    URINARY URGE INCONTINENCE: At visit 07/29/2018 increase in Ditropan XL to 15 MG done as patient reported increased episodes of incontinence.  She had been on Ditropan XL 10 MG for one year, which had worked well but then she began having increased episodes of incontinence.  She reports the 15 MG has been "very helpful", but at last visit to pharmacy they gave her 10 MG.  Spoke to pharmacy via telephone with patient in room, the do report providing patient 10 MG tablets at last fill.  Discussed with them that 10 MG tablets should be discontinued and only have 15 Mg tablets on chart.  They performed changes in her chart and are filling 15 MG tablets today.  Reiterated to patient risks/benefits of medication.  She denies decreased urine output, dry eyes, falls, dry mouth, or constipation.   Dysuria: no Urinary frequency: no Urgency: yes Small volume voids: no Urinary incontinence: occasional, but improved with 15 MG XL  Status: better with 15 MG Previous urinary tract infection: no Recurrent urinary tract infection: no  OSTEOARTHRITIS: Main area of issue is left hip.  At baseline she is overweight and does utilize cane as needed for ambulation.  Left hip pain has been ongoing issue.  In past has benefited from short burst of Prednisone and Meloxicam, is aware of risks/benefits.  She has declined ortho consult or injections in past and declines today.  Also has discomfort to both hips and bilateral knees.  States she did not pick up her recent Prednisone and Meloxicam prescription, states "they did not call me".  Spoke with pharmacy  this morning and medications are filled, discussed with patient.  Educated patient on ice and heat for discomfort, provided her with ice pack and educated her on use.  She is going to pick up heating pad at store.  Recent imaging of left hip did note osteoarthritis. Duration: chronic Involved hip: bilateral  Location: anterior Onset: gradual  Severity: 10/10 at worst Quality: sharp and aching Frequency: intermittent, when present it can last 24 hours but then improves Radiation: no Aggravating factors: walking, bending, weight bearing Alleviating factors: APAP and rest  Status: fluctuating Treatments attempted: APAP and ibuprofen   Relief with NSAIDs?: mild Weakness with weight bearing: no Weakness with walking: no Paresthesias / decreased sensation: no Swelling: no Redness:no Fevers: no  Review of Systems  Constitutional: Negative for activity change, appetite change, diaphoresis, fatigue and fever.  Respiratory: Negative for cough, chest tightness and shortness of breath.   Cardiovascular: Negative for chest pain, palpitations and leg swelling.  Gastrointestinal: Negative for abdominal distention, abdominal pain, constipation, diarrhea, nausea and vomiting.  Endocrine: Negative for cold intolerance, heat intolerance, polydipsia, polyphagia and polyuria.  Genitourinary: Negative for decreased urine volume, dyspareunia, dysuria, frequency and urgency.  Musculoskeletal: Positive for arthralgias.  Neurological: Negative for dizziness, syncope, weakness, light-headedness, numbness and headaches.  Psychiatric/Behavioral: Negative.     Per HPI unless specifically indicated above     Objective:  BP 131/84   Pulse 90   Temp 99 F (37.2 C) (Oral)   Ht  (1.549 m)   Wt 192 lb (87.1 kg)   SpO2 94%   BMI 36.28 kg/m   Wt Readings from Last 3 Encounters:  11/01/18 192 lb (87.1 kg)  07/29/18 194 lb (88 kg)  07/29/18 194 lb (88 kg)    Physical Exam Vitals signs and nursing  note reviewed.  Constitutional:      General: She is awake.     Appearance: She is well-developed.  HENT:     Head: Normocephalic.     Right Ear: Hearing normal.     Left Ear: Hearing normal.     Nose: Nose normal.     Mouth/Throat:     Mouth: Mucous membranes are moist.  Eyes:     General: Lids are normal.        Right eye: No discharge.        Left eye: No discharge.     Conjunctiva/sclera: Conjunctivae normal.     Pupils: Pupils are equal, round, and reactive to light.  Neck:     Musculoskeletal: Normal range of motion and neck supple.     Thyroid: No thyromegaly.     Vascular: No carotid bruit or JVD.  Cardiovascular:     Rate and Rhythm: Normal rate and regular rhythm.     Heart sounds: Normal heart sounds. No murmur. No gallop.   Pulmonary:     Effort: Pulmonary effort is normal.     Breath sounds: Normal breath sounds.  Abdominal:     General: Bowel sounds are normal.     Palpations: Abdomen is soft. There is no hepatomegaly or splenomegaly.  Musculoskeletal:     Right hip: She exhibits decreased range of motion. She exhibits normal strength, no tenderness, no swelling and no laceration.     Left hip: She exhibits decreased range of motion and decreased strength. She exhibits no tenderness, no swelling, no crepitus and no laceration.     Right lower leg: No edema.     Left lower leg: No edema.     Comments: Noted to use cane.  Walks slowly to exam table and needs assist sitting on table.    Lymphadenopathy:     Cervical: No cervical adenopathy.  Skin:    General: Skin is warm and dry.  Neurological:     Mental Status: She is alert and oriented to person, place, and time.  Psychiatric:        Attention and Perception: Attention normal.        Mood and Affect: Mood normal.        Behavior: Behavior normal. Behavior is cooperative.        Thought Content: Thought content normal.        Judgment: Judgment normal.     Results for orders placed or performed in  visit on 05/15/17  Comprehensive metabolic panel  Result Value Ref Range   Glucose 84 65 - 99 mg/dL   BUN 14 8 - 27 mg/dL   Creatinine, Ser 2.95 0.57 - 1.00 mg/dL   GFR calc non Af Amer 76 >59 mL/min/1.73   GFR calc Af Amer 87 >59 mL/min/1.73   BUN/Creatinine Ratio 19 12 - 28   Sodium 138 134 - 144 mmol/L   Potassium 4.6 3.5 - 5.2 mmol/L   Chloride 104 96 - 106 mmol/L   CO2 21 20 - 29 mmol/L   Calcium 9.3 8.7 -  10.3 mg/dL   Total Protein 7.4 6.0 - 8.5 g/dL   Albumin 4.3 3.5 - 4.7 g/dL   Globulin, Total 3.1 1.5 - 4.5 g/dL   Albumin/Globulin Ratio 1.4 1.2 - 2.2   Bilirubin Total 0.3 0.0 - 1.2 mg/dL   Alkaline Phosphatase 83 39 - 117 IU/L   AST 23 0 - 40 IU/L   ALT 16 0 - 32 IU/L  Lipid Panel w/o Chol/HDL Ratio  Result Value Ref Range   Cholesterol, Total 199 100 - 199 mg/dL   Triglycerides 543 0 - 149 mg/dL   HDL 58 >60 mg/dL   VLDL Cholesterol Cal 20 5 - 40 mg/dL   LDL Calculated 677 (H) 0 - 99 mg/dL  Alpha Gal IgE  Result Value Ref Range   Alpha Gal IgE* <0.10 <0.10 kU/L      Assessment & Plan:   Problem List Items Addressed This Visit      Musculoskeletal and Integument   Osteoarthritis    Bilateral hips and knees.  Uses cane.  Refuses ortho, PT, or injections at this time.  Short Prednisone and Meloxicam burst sent.  She is aware not to take Ibuprofen while taking Meloxicam.  Discussed short burst only due to risk of GI symptoms and kidney issues.  Educated on heat and ice, provided ice pack to take home.  Continue to use Tylenol as needed.  Return in 3 months or sooner if worsening symptoms.        Other   Urge incontinence of urine - Primary    Chronic, ongoing.  Reports improvement with 15 MG and no ADR.  Discussed with pharmacy Continue current medication regimen.  Return in 3 months.         Time: 25 minutes, >50% spent educating on pain control with osteoarthritis  Follow up plan: Return in about 3 months (around 02/01/2019) for Osteoarthritis, HTN/HLD, A  FIB, and urge incontinence.

## 2018-11-01 NOTE — Assessment & Plan Note (Signed)
Chronic, ongoing.  Reports improvement with 15 MG and no ADR.  Discussed with pharmacy Continue current medication regimen.  Return in 3 months.

## 2018-11-01 NOTE — Assessment & Plan Note (Signed)
Bilateral hips and knees.  Uses cane.  Refuses ortho, PT, or injections at this time.  Short Prednisone and Meloxicam burst sent.  She is aware not to take Ibuprofen while taking Meloxicam.  Discussed short burst only due to risk of GI symptoms and kidney issues.  Educated on heat and ice, provided ice pack to take home.  Continue to use Tylenol as needed.  Return in 3 months or sooner if worsening symptoms.

## 2018-11-01 NOTE — Patient Instructions (Signed)
Artrosis  Osteoarthritis    La artrosis es un tipo de artritis que afecta el tejido que cubre los extremos de los huesos en las articulaciones (cartlago). El cartlago acta como amortiguador entre los huesos y los ayuda a moverse con suavidad. La artrosis se produce cuando el cartlago de las articulaciones se gasta. A veces, la artrosis se denomina artritis "por uso y desgaste".  La artrosis es la forma ms frecuente de artritis. A menudo, afecta a las personas mayores. Es una enfermedad que empeora con el tiempo (una enfermedad progresiva). Esta enfermedad afecta con ms frecuencia las articulaciones de:   Los dedos de las manos.   Los dedos de los pies.   La cadera.   Las rodillas.   La columna vertebral, incluido el cuello y la zona lumbar.  Cules son las causas?  El desgaste del cartlago que cubre los extremos de los huesos relacionado con la edad, causa esta afeccin.  Qu incrementa el riesgo?  Los siguientes factores pueden hacer que usted sea ms propenso a tener esta afeccin:   Edad avanzada.   Tener exceso de peso u obesidad.   Uso excesivo de las articulaciones, como en el caso de los atletas.   Lesin pasada de una articulacin.   Ciruga pasada en una articulacin.   Antecedentes familiares de artrosis.  Cules son los signos o los sntomas?  Los principales sntomas de esta enfermedad son dolor, hinchazn y rigidez en la articulacin. Con el tiempo, la articulacin puede perder su forma. Pequeos trozos de hueso o cartlago se pueden desprender y flotar dentro de la articulacin, lo cual puede causar ms dolor y dao en la articulacin. Pueden formarse pequeos depsitos de hueso (ostefitos) en los extremos de la articulacin. Otros sntomas pueden incluir lo siguiente:   Una sensacin de chirrido o raspado dentro de la articulacin al moverla.   Sonidos de chasquido o crujido al moverse.  Los sntomas pueden afectar una o ms articulaciones. La artrosis en una articulacin  principal, como la rodilla o la cadera, puede causar dolor al caminar o al realizar ejercicio. Si tiene artrosis en las manos, es posible que no pueda agarrar objetos, torcer la mano o controlar pequeos movimientos de las manos y los dedos (motricidad fina).  Cmo se diagnostica?  Esta afeccin se puede diagnosticar en funcin de lo siguiente:   Sus antecedentes mdicos.   Un examen fsico.   Sus sntomas.   Radiografas de la(s) articulacin(es) afectada(s).   Anlisis de sangre para descartar otros tipos de artritis.  Cmo se trata?  No hay cura para esta enfermedad, pero el tratamiento puede ayudar a controlar el dolor y mejorar el funcionamiento de la articulacin. Los planes de tratamiento pueden incluir:   Un programa de ejercicio indicado que permita el descanso y el alivio de la articulacin. Puede trabajar con un fisioterapeuta.   Un plan de control del peso.   Tcnicas de alivio del dolor, como:  ? Aplicacin de calor y fro en la articulacin.  ? Impulsos elctricos aplicados a las terminaciones nerviosas que se encuentran debajo de la piel (estimulacin nerviosa elctrica transcutnea o TENS).  ? Masajes.  ? Ciertos suplementos nutricionales.   Antiinflamatorios no esteroideos (AINE) o medicamentos recetados para ayudar a aliviar el dolor.   Medicamentos para ayudar a aliviar el dolor y la inflamacin (corticoesteroides). Estos se pueden administrar por boca (va oral) o mediante una inyeccin.   Dispositivos de ayuda, como un dispositivo ortopdico, una frula, un guante especial o un   bastn.   Ciruga, como:  ? Una osteotoma. Se hace para volver a posicionar los huesos y aliviar el dolor o para retirar los trozos sueltos de hueso y cartlago.  ? Ciruga de reemplazo articular. Es posible que necesite esta ciruga si tiene una artrosis muy grave (avanzada).  Siga estas indicaciones en su casa:  Actividad   Descanse las articulaciones afectadas segn las indicaciones del mdico.   No  conduzca ni use maquinaria pesada mientras toma analgsicos recetados.   Haga ejercicio segn le indiquen. Es posible que el mdico o el fisioterapeuta le recomienden tipos especficos de ejercicio, tales como:  ? Ejercicios de fortalecimiento. Se realizan para fortalecer los msculos que sostienen las articulaciones afectadas por la artritis. Pueden realizarse con peso o con bandas para agregar resistencia.  ? Ejercicios aerbicos. Son ejercicios, como caminar a paso ligero o hacer gimnasia aerbica acutica, que aumentan la actividad del corazn.  ? Actividades de amplitud de movimientos. Facilitan el movimiento de las articulaciones.  ? Ejercicios de equilibrio y agilidad.  Control del dolor, la rigidez y la hinchazn          Si se lo indican, aplique calor en la zona afectada con la frecuencia que le haya indicado el mdico. Use la fuente de calor que el mdico le recomiende, como una compresa de calor hmedo o una almohadilla trmica.  ? Si tiene un dispositivo de ayuda que se puede quitar, quteselo segn lo indicado por su mdico.  ? Coloque una toalla entre la piel y la fuente de calor. Si el mdico le indica que no se quite el dispositivo de ayuda mientras se aplica calor, coloque una toalla entre el dispositivo de ayuda y la fuente de calor.  ? Aplique el calor durante 20 a 30minutos.  ? Retire la fuente de calor si la piel se pone de color rojo brillante. Esto es muy importante si no puede sentir dolor, calor o fro. Puede correr un riesgo mayor de sufrir quemaduras.   Si se lo indican, aplique hielo sobre la articulacin afectada:  ? Si tiene un dispositivo de ayuda que se puede quitar, quteselo segn lo indicado por su mdico.  ? Ponga el hielo en una bolsa plstica.  ? Coloque una toalla entre la piel y la bolsa de hielo. Si el mdico le indica que no se quite el dispositivo de ayuda mientras se aplica hielo, coloque una toalla entre el dispositivo de ayuda y la bolsa de hielo.  ? Coloque el  hielo durante 20minutos, 2 a 3veces por da.  Instrucciones generales   Tome los medicamentos de venta libre y los recetados solamente como se lo haya indicado el mdico.   Mantenga un peso saludable. Siga las instrucciones de su mdico con respecto al control de su peso. Estas pueden incluir restricciones en la dieta.   No consuma ningn producto que contenga nicotina o tabaco, como cigarrillos y cigarrillos electrnicos. Estos pueden retrasar la consolidacin del hueso. Si necesita ayuda para dejar de fumar, consulte al mdico.   Use los dispositivos de ayuda como se lo haya indicado el mdico.   Concurra a todas las visitas de seguimiento como se lo haya indicado el mdico. Esto es importante.  Dnde encontrar ms informacin:   Instituto Nacional de Reumatismo Articular y Enfermedades Musculoesquelticas y Dermatolgicas (National Institute of Arthritis and Musculoskeletal and Skin Diseases): www.niams.nih.gov   Instituto Nacional sobre el Envejecimiento (National Institute on Aging): www.nia.nih.gov   Instituto Estadounidense de Reumatologa (American College of Rheumatology):   www.rheumatology.org  Comunquese con un mdico si:   Su piel se pone roja.   Presenta una erupcin cutnea.   Siente un dolor que empeora.   Tiene fiebre y siente dolor en la articulacin o el msculo.  Solicite ayuda de inmediato si:   Pierde mucho peso.   Pierde el apetito repentinamente.   Transpira durante la noche.  Resumen   La artrosis es una clase de artritis que afecta el tejido que cubre los extremos de los huesos en las articulaciones (cartlago).   El desgaste del cartlago que cubre los extremos de los huesos relacionado con la edad, causa esta afeccin.   Los sntomas principales de esta enfermedad son dolor, hinchazn y rigidez en la articulacin.   No hay cura para esta enfermedad, pero el tratamiento puede ayudar a controlar el dolor y mejorar el funcionamiento de la articulacin.  Esta informacin  no tiene como fin reemplazar el consejo del mdico. Asegrese de hacerle al mdico cualquier pregunta que tenga.  Document Released: 03/15/2005 Document Revised: 05/23/2017 Document Reviewed: 02/10/2013  Elsevier Interactive Patient Education  2019 [employer]vier Inc.

## 2018-11-02 ENCOUNTER — Other Ambulatory Visit: Payer: Self-pay | Admitting: Nurse Practitioner

## 2018-11-04 ENCOUNTER — Other Ambulatory Visit: Payer: Self-pay | Admitting: Nurse Practitioner

## 2018-11-14 ENCOUNTER — Telehealth: Payer: Self-pay

## 2018-11-14 NOTE — Telephone Encounter (Signed)
Virtual Visit Pre-Appointment Phone Call  "Holly Lewis, I am calling you today to discuss your upcoming appointment. We are currently trying to limit exposure to the virus that causes COVID-19 by seeing patients at home rather than in the office."  1. "What is the BEST phone number to call the day of the visit?" - include this in appointment notes  2. Do you have or have access to (through a family member/friend) a smartphone with video capability that we can use for your visit?" a. If yes - list this number in appt notes as cell (if different from BEST phone #) and list the appointment type as a VIDEO visit in appointment notes b. If no - list the appointment type as a PHONE visit in appointment notes  3. Confirm consent - "In the setting of the current Covid19 crisis, you are scheduled for a phone visit with your provider on Nov 15, 2018 at 11:00AM.  Just as we do with many in-office visits, in order for you to participate in this visit, we must obtain consent.  If you'd like, I can send this to your mychart (if signed up) or email for you to review.  Otherwise, I can obtain your verbal consent now.  All virtual visits are billed to your insurance company just like a normal visit would be.  By agreeing to a virtual visit, we'd like you to understand that the technology does not allow for your provider to perform an examination, and thus may limit your provider's ability to fully assess your condition. If your provider identifies any concerns that need to be evaluated in person, we will make arrangements to do so.  Finally, though the technology is pretty good, we cannot assure that it will always work on either your or our end, and in the setting of a video visit, we may have to convert it to a phone-only visit.  In either situation, we cannot ensure that we have a secure connection.  Are you willing to proceed?" STAFF: Did the patient verbally acknowledge consent to telehealth visit? Document YES/NO  here: YES  4. Advise patient to be prepared - "Two hours prior to your appointment, go ahead and check your blood pressure, pulse, oxygen saturation, and your weight (if you have the equipment to check those) and write them all down. When your visit starts, your provider will ask you for this information. If you have an Apple Watch or Kardia device, please plan to have heart rate information ready on the day of your appointment. Please have a pen and paper handy nearby the day of the visit as well."  5. Give patient instructions for MyChart download to smartphone OR [email or web address] as below if video visit (depending on what platform provider is using)  6. Inform patient they will receive a phone call 15 minutes prior to their appointment time (may be from unknown caller ID) so they should be prepared to answer    TELEPHONE CALL NOTE  Stephannie Peterslma Shirah has been deemed a candidate for a follow-up tele-health visit to limit community exposure during the Covid-19 pandemic. I spoke with the patient via phone to ensure availability of phone/video source, confirm preferred email & phone number, and discuss instructions and expectations.  I reminded Benigna Ambriz to be prepared with any vital sign and/or heart rhythm information that could potentially be obtained via home monitoring, at the time of her visit. I reminded Stephannie Peterslma Bordeau to expect a phone call prior to her visit.  Andi Devon 11/14/2018 2:38 PM    FULL LENGTH CONSENT FOR TELE-HEALTH VISIT   I hereby voluntarily request, consent and authorize [facility] and its employed or contracted physicians, physician assistants, nurse practitioners or other licensed health care professionals (the Practitioner), to provide me with telemedicine health care services (the Services") as deemed necessary by the treating Practitioner. I acknowledge and consent to receive the Services by the Practitioner via telemedicine. I understand that  the telemedicine visit will involve communicating with the Practitioner through live audiovisual communication technology and the disclosure of certain medical information by electronic transmission. I acknowledge that I have been given the opportunity to request an in-person assessment or other available alternative prior to the telemedicine visit and am voluntarily participating in the telemedicine visit.  I understand that I have the right to withhold or withdraw my consent to the use of telemedicine in the course of my care at any time, without affecting my right to future care or treatment, and that the Practitioner or I may terminate the telemedicine visit at any time. I understand that I have the right to inspect all information obtained and/or recorded in the course of the telemedicine visit and may receive copies of available information for a reasonable fee.  I understand that some of the potential risks of receiving the Services via telemedicine include:   Delay or interruption in medical evaluation due to technological equipment failure or disruption;  Information transmitted may not be sufficient (e.g. poor resolution of images) to allow for appropriate medical decision making by the Practitioner; and/or   In rare instances, security protocols could fail, causing a breach of personal health information.  Furthermore, I acknowledge that it is my responsibility to provide information about my medical history, conditions and care that is complete and accurate to the best of my ability. I acknowledge that Practitioner's advice, recommendations, and/or decision may be based on factors not within their control, such as incomplete or inaccurate data provided by me or distortions of diagnostic images or specimens that may result from electronic transmissions. I understand that the practice of medicine is not an exact science and that Practitioner makes no warranties or guarantees regarding treatment  outcomes. I acknowledge that I will receive a copy of this consent concurrently upon execution via email to the email address I last provided but may also request a printed copy by calling the office of [facility].    I understand that my insurance will be billed for this visit.   I have read or had this consent read to me.  I understand the contents of this consent, which adequately explains the benefits and risks of the Services being provided via telemedicine.   I have been provided ample opportunity to ask questions regarding this consent and the Services and have had my questions answered to my satisfaction.  I give my informed consent for the services to be provided through the use of telemedicine in my medical care  By participating in this telemedicine visit I agree to the above.

## 2018-11-15 ENCOUNTER — Telehealth (INDEPENDENT_AMBULATORY_CARE_PROVIDER_SITE_OTHER): Payer: Medicare HMO | Admitting: Cardiovascular Disease

## 2018-11-15 ENCOUNTER — Other Ambulatory Visit: Payer: Self-pay

## 2018-11-15 ENCOUNTER — Encounter: Payer: Self-pay | Admitting: Cardiovascular Disease

## 2018-11-15 VITALS — BP 127/75 | HR 96 | Ht 61.0 in | Wt 190.0 lb

## 2018-11-15 DIAGNOSIS — I480 Paroxysmal atrial fibrillation: Secondary | ICD-10-CM

## 2018-11-15 NOTE — Patient Instructions (Signed)
Medication Instructions:  No new medications If you need a refill on your cardiac medications before your next appointment, please call your pharmacy.   Lab work: None If you have labs (blood work) drawn today and your tests are completely normal, you will receive your results only by: Marland Kitchen MyChart Message (if you have MyChart) OR . A paper copy in the mail If you have any lab test that is abnormal or we need to change your treatment, we will call you to review the results.  Testing/Procedures: None  Follow-Up: At [facility], you and your health needs are our priority.  As part of our continuing mission to provide you with exceptional heart care, we have created designated Provider Care Teams.  These Care Teams include your primary Cardiologist (physician) and Advanced Practice Providers (APPs -  Physician Assistants and Nurse Practitioners) who all work together to provide you with the care you need, when you need it. You will need a follow up appointment in 6 months.  Please call our office 2 months in advance to schedule this appointment.  You may see Lorine Bears, MD or one of the following Advanced Practice Providers on your designated Care Team:   Nicolasa Ducking, NP Eula Listen, PA-C . Marisue Ivan, PA-C

## 2018-11-15 NOTE — Progress Notes (Signed)
Virtual Visit via Telephone Note   This visit type was conducted due to national recommendations for restrictions regarding the COVID-19 Pandemic (e.g. social distancing) in an effort to limit this patient's exposure and mitigate transmission in our community.  Due to her co-morbid illnesses, this patient is at least at moderate risk for complications without adequate follow up.  This format is felt to be most appropriate for this patient at this time.  The patient did not have access to video technology/had technical difficulties with video requiring transitioning to audio format only (telephone).  All issues noted in this document were discussed and addressed.  No physical exam could be performed with this format.  Please refer to the patient's chart for her  consent to telehealth for [facility].   Date:  11/15/2018   ID:  Holly PetersAlma Lewis, DOB 10-06-1932, MRN [id number]  Patient Location: Home Provider Location: Office  PCP:  Marjie Skiffannady, Jolene T, NP  Cardiologist:  Lorine BearsMuhammad Arida, MD  Electrophysiologist:  None   Evaluation Performed:  Follow-Up Visit  Chief Complaint: No complaints today.  History of Present Illness:    Holly Peterslma Holly Lewis is a 85 y.o. female was reached via phone for follow-up regarding paroxysmal atrial fibrillation.  She has other chronic medical conditions including hypertension, hyperlipidemia, cataracts, anxiety, and chest pain.  She reportedly underwent stress testing in 2016 which was normal.  Paroxysmal atrial fibrillation was apparently diagnosed in 2017 and she is previously described to us that she underwent TEE and cardioversion at that time.  She stopped Eliquis due to inability to afford the medication and she refused to go on any other alternatives.  She has not had any documented arrhythmia since she has been following with us.  She is doing well with no chest pain, shortness of breath or palpitations.  The patient does not have symptoms concerning for  COVID-19 infection (fever, chills, cough, or new shortness of breath).    Past Medical History:  Diagnosis Date  . Anxiety   . Blood transfusion without reported diagnosis   . Cataract   . Chest pain    a. Pt reports nl stress test in 2016 in [place]; b. 05/2017 declined stress test for recurrent c/p.  Marland Kitchen. Hyperlipidemia   . Hypertension   . PAF (paroxysmal atrial fibrillation) (HCC)    a. Dx in 2017 in [place] per pt-->s/p TEE/DCCV;  b. CHA2DS2VASc = 4-->Eliquis.   Past Surgical History:  Procedure Laterality Date  . CHOLECYSTECTOMY    . EYE SURGERY    . 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 kidney stones       Current Meds  Medication Sig  . doxepin (SINEQUAN) 10 MG capsule TAKE ONE CAPSULE BY MOUTH AT BEDTIME  . meloxicam (MOBIC) 15 MG tablet Take 0.5 tablets (7.5 mg total) by mouth daily. (Patient taking differently: Take 7.5 mg by mouth daily as needed. )  . oxybutynin (DITROPAN XL) 15 MG 24 hr tablet Take 1 tablet (15 mg total) by mouth at bedtime.  . predniSONE (DELTASONE) 20 MG tablet TAKE 2 TABLETS(40 MG) BY MOUTH DAILY WITH BREAKFAST FOR 5 DAYS (Patient taking differently: as needed. )     Allergies:   Diltiazem and Latex   Social History   Tobacco Use  . Smoking status: Never Smoker  . Smokeless tobacco: Never Used  Substance Use Topics  . Alcohol use: No  . Drug use: No  Family Hx: The patient's family history includes Alzheimer's disease in her sister; Cancer in her brother and sister; Diabetes in her brother, mother, and sister.  ROS:   Please see the history of present illness.     All other systems reviewed and are negative.   Prior CV studies:   The following studies were reviewed today:    Labs/Other Tests and Data Reviewed:    EKG:  No ECG reviewed.  Recent Labs: No results found for requested labs within last 8760 hours.   Recent Lipid Panel Lab Results  Component Value Date/Time   CHOL 199  05/15/2017 09:29 AM   TRIG 101 05/15/2017 09:29 AM   HDL 58 05/15/2017 09:29 AM   LDLCALC 121 (H) 05/15/2017 09:29 AM    Wt Readings from Last 3 Encounters:  11/15/18 190 lb (86.2 kg)  11/01/18 192 lb (87.1 kg)  07/29/18 194 lb (88 kg)     Objective:    Vital Signs:  BP 127/75   Pulse 96   Ht 5\' 1"  (1.549 m)   Wt 190 lb (86.2 kg)   BMI 35.90 kg/m    VITAL SIGNS:  reviewed  ASSESSMENT & PLAN:    1.  Paroxysmal atrial fibrillation: This was diagnosed in [place] in 2017 in the setting of a 2-week hospitalization for pneumonia.  She says she underwent TEE and cardioversion at that time.  No recurrent arrhythmias so far and since then, the patient has declined anticoagulation.  She is also not on any AV nodal blocking agent and given issues with other medications that of cause rash, including diltiazem, she does not wish to start anything at this time.  Continue clinical monitoring for now.  2.  Essential hypertension: Blood pressure is controlled without medications.   COVID-19 Education: The signs and symptoms of COVID-19 were discussed with the patient and how to seek care for testing (follow up with PCP or arrange E-visit).  The importance of social distancing was discussed today.  Time:   Today, I have spent 6 minutes with the patient with telehealth technology discussing the above problems.     Medication Adjustments/Labs and Tests Ordered: Current medicines are reviewed at length with the patient today.  Concerns regarding medicines are outlined above.   Tests Ordered: No orders of the defined types were placed in this encounter.   Medication Changes: No orders of the defined types were placed in this encounter.   Disposition:  Follow up in 6 month(s)  Signed, Lorine Bears, MD  11/15/2018 11:01 AM     [facility]

## 2018-11-19 ENCOUNTER — Other Ambulatory Visit: Payer: Self-pay | Admitting: Nurse Practitioner

## 2019-01-11 ENCOUNTER — Other Ambulatory Visit: Payer: Self-pay | Admitting: Family Medicine

## 2019-01-11 NOTE — Telephone Encounter (Signed)
Requested Prescriptions  Pending Prescriptions Disp Refills  . doxepin (SINEQUAN) 10 MG capsule [Pharmacy Med Name: DOXEPIN 10MG  CAPSULES] 90 capsule 0    Sig: TAKE ONE CAPSULE BY MOUTH AT BEDTIME     Psychiatry:  Antidepressants - Heterocyclics (TCAs) Failed - 01/11/2019  9:12 AM      Failed - Completed PHQ-2 or PHQ-9 in the last 360 days.      Passed - Valid encounter within last 6 months    Recent Outpatient Visits          2 months ago Urge incontinence of urine   [place], [place] T, NP   5 months ago Essential hypertension, benign   [place], [place] T, NP   7 months ago Primary osteoarthritis of left hip   [place], Barbaraann Faster, NP   8 months ago Flu vaccine need   [employer], Henrine Screws T, NP   1 year ago [place] Kathrine Haddock, NP      Future Appointments            In 3 weeks [place], Barbaraann Faster, NP [employer]   In 6 months  [employer]

## 2019-02-05 ENCOUNTER — Other Ambulatory Visit: Payer: Self-pay

## 2019-02-05 ENCOUNTER — Encounter: Payer: Self-pay | Admitting: Nurse Practitioner

## 2019-02-05 ENCOUNTER — Ambulatory Visit (INDEPENDENT_AMBULATORY_CARE_PROVIDER_SITE_OTHER): Payer: Medicare HMO | Admitting: Nurse Practitioner

## 2019-02-05 DIAGNOSIS — M159 Polyosteoarthritis, unspecified: Secondary | ICD-10-CM

## 2019-02-05 DIAGNOSIS — I480 Paroxysmal atrial fibrillation: Secondary | ICD-10-CM | POA: Diagnosis not present

## 2019-02-05 DIAGNOSIS — M150 Primary generalized (osteo)arthritis: Secondary | ICD-10-CM

## 2019-02-05 DIAGNOSIS — I10 Essential (primary) hypertension: Secondary | ICD-10-CM | POA: Diagnosis not present

## 2019-02-05 DIAGNOSIS — E7800 Pure hypercholesterolemia, unspecified: Secondary | ICD-10-CM | POA: Diagnosis not present

## 2019-02-05 DIAGNOSIS — N3941 Urge incontinence: Secondary | ICD-10-CM

## 2019-02-05 MED ORDER — DOXEPIN HCL 10 MG PO CAPS: 10.0000 mg | ORAL_CAPSULE | Freq: Every day | ORAL | 3 refills | Status: DC

## 2019-02-05 MED ORDER — MELOXICAM 15 MG PO TABS: 7.5000 mg | ORAL_TABLET | Freq: Every day | ORAL | 1 refills | Status: DC | PRN

## 2019-02-05 MED ORDER — OXYBUTYNIN CHLORIDE ER 15 MG PO TB24: 15.0000 mg | ORAL_TABLET | Freq: Every day | ORAL | 3 refills | Status: DC

## 2019-02-05 NOTE — Assessment & Plan Note (Signed)
Chronic, well controlled without medications.  She decline treatments.  Continue to collaborate with cardiology.

## 2019-02-05 NOTE — Assessment & Plan Note (Signed)
Chronic, stable.  Refuses PT.  Continue simple treatment at home.

## 2019-02-05 NOTE — Patient Instructions (Addendum)
Look into Voltaren gel and Biofreeze.  Perform chair exercises at home.  Osteoarthritis  Osteoarthritis is a type of arthritis that affects tissue that covers the ends of bones in joints (cartilage). Cartilage acts as a cushion between the bones and helps them move smoothly. Osteoarthritis results when cartilage in the joints gets worn down. Osteoarthritis is sometimes called "wear and tear" arthritis. Osteoarthritis is the most common form of arthritis. It often occurs in older people. It is a condition that gets worse over time (a progressive condition). Joints that are most often affected by this condition are in:  Fingers.  Toes.  Hips.  Knees.  Spine, including neck and lower back. What are the causes? This condition is caused by age-related wearing down of cartilage that covers the ends of bones. What increases the risk? The following factors may make you more likely to develop this condition:  Older age.  Being overweight or obese.  Overuse of joints, such as in athletes.  Past injury of a joint.  Past surgery on a joint.  Family history of osteoarthritis. What are the signs or symptoms? The main symptoms of this condition are pain, swelling, and stiffness in the joint. The joint may lose its shape over time. Small pieces of bone or cartilage may break off and float inside of the joint, which may cause more pain and damage to the joint. Small deposits of bone (osteophytes) may grow on the edges of the joint. Other symptoms may include:  A grating or scraping feeling inside the joint when you move it.  Popping or creaking sounds when you move. Symptoms may affect one or more joints. Osteoarthritis in a major joint, such as your knee or hip, can make it painful to walk or exercise. If you have osteoarthritis in your hands, you might not be able to grip items, twist your hand, or control small movements of your hands and fingers (fine motor skills). How is this diagnosed?  This condition may be diagnosed based on:  Your medical history.  A physical exam.  Your symptoms.  X-rays of the affected joint(s).  Blood tests to rule out other types of arthritis. How is this treated? There is no cure for this condition, but treatment can help to control pain and improve joint function. Treatment plans may include:  A prescribed exercise program that allows for rest and joint relief. You may work with a physical therapist.  A weight control plan.  Pain relief techniques, such as: ? Applying heat and cold to the joint. ? Electric pulses delivered to nerve endings under the skin (transcutaneous electrical nerve stimulation, or TENS). ? Massage. ? Certain nutritional supplements.  NSAIDs or prescription medicines to help relieve pain.  Medicine to help relieve pain and inflammation (corticosteroids). This can be given by mouth (orally) or as an injection.  Assistive devices, such as a brace, wrap, splint, specialized glove, or cane.  Surgery, such as: ? An osteotomy. This is done to reposition the bones and relieve pain or to remove loose pieces of bone and cartilage. ? Joint replacement surgery. You may need this surgery if you have very bad (advanced) osteoarthritis. Follow these instructions at home: Activity  Rest your affected joints as directed by your health care provider.  Do not drive or use heavy machinery while taking prescription pain medicine.  Exercise as directed. Your health care provider or physical therapist may recommend specific types of exercise, such as: ? Strengthening exercises. These are done to strengthen the  muscles that support joints that are affected by arthritis. They can be performed with weights or with exercise bands to add resistance. ? Aerobic activities. These are exercises, such as brisk walking or water aerobics, that get your heart pumping. ? Range-of-motion activities. These keep your joints easy to move. ?  Balance and agility exercises. Managing pain, stiffness, and swelling      If directed, apply heat to the affected area as often as told by your health care provider. Use the heat source that your health care provider recommends, such as a moist heat pack or a heating pad. ? If you have a removable assistive device, remove it as told by your health care provider. ? Place a towel between your skin and the heat source. If your health care provider tells you to keep the assistive device on while you apply heat, place a towel between the assistive device and the heat source. ? Leave the heat on for 20-30 minutes. ? Remove the heat if your skin turns bright red. This is especially important if you are unable to feel pain, heat, or cold. You may have a greater risk of getting burned.  If directed, put ice on the affected joint: ? If you have a removable assistive device, remove it as told by your health care provider. ? Put ice in a plastic bag. ? Place a towel between your skin and the bag. If your health care provider tells you to keep the assistive device on during icing, place a towel between the assistive device and the bag. ? Leave the ice on for 20 minutes, 2-3 times a day. General instructions  Take over-the-counter and prescription medicines only as told by your health care provider.  Maintain a healthy weight. Follow instructions from your health care provider for weight control. These may include dietary restrictions.  Do not use any products that contain nicotine or tobacco, such as cigarettes and e-cigarettes. These can delay bone healing. If you need help quitting, ask your health care provider.  Use assistive devices as directed by your health care provider.  Keep all follow-up visits as told by your health care provider. This is important. Where to find more information  [employer] of Arthritis and Musculoskeletal and Skin Diseases: www.niams.SouthExposed.es  [employer] on Aging: http://kim-miller.com/  American College of Rheumatology: www.rheumatology.org Contact a health care provider if:  Your skin turns red.  You develop a rash.  You have pain that gets worse.  You have a fever along with joint or muscle aches. Get help right away if:  You lose a lot of weight.  You suddenly lose your appetite.  You have night sweats. Summary  Osteoarthritis is a type of arthritis that affects tissue covering the ends of bones in joints (cartilage).  This condition is caused by age-related wearing down of cartilage that covers the ends of bones.  The main symptom of this condition is pain, swelling, and stiffness in the joint.  There is no cure for this condition, but treatment can help to control pain and improve joint function. This information is not intended to replace advice given to you by your health care provider. Make sure you discuss any questions you have with your health care provider. Document Released: 06/05/2005 Document Revised: 05/18/2017 Document Reviewed: 02/07/2016 Elsevier Patient Education  2020 [employer].

## 2019-02-05 NOTE — Assessment & Plan Note (Signed)
Chronic, ongoing.  Consider reduction of Oxybutynin in future and educated on Kegel exercises and decreasing water intake at night.  Return in 6 months.

## 2019-02-05 NOTE — Assessment & Plan Note (Signed)
Chronic, ongoing.  Refuses statin and refuses labs today.  Continue to monitor. 

## 2019-02-05 NOTE — Progress Notes (Signed)
BP 132/85   Pulse 85   Temp 98.7 F (37.1 C) (Oral)   Ht 5\' 1"  (1.549 m)   Wt 189 lb (85.7 kg)   SpO2 99%   BMI 35.71 kg/m    Subjective:    Patient ID: Holly Lewis, female    DOB: 1933/02/21, 85 y.o.   MRN: [id number]  HPI: Holly Lewis is a 85 y.o. female  Chief Complaint  Patient presents with  . Osteoarthritis    1579m f/u  . Urge incantinance  . Hypertension  . Hyperlipidemia   OSTEOARTHRITIS: Reports "as a rule I just put up with it", at baseline has "aches and pains every day".  Takes Tylenol occasionally.  Is taking occasional Meloxicam. Her arthritis ranges from hips to knees.  Does endorse being on the move often and working around house.  Uses heat at times.  Has not been using creams.  Does not wish to go to physical therapy.    HYPERTENSION / HYPERLIPIDEMIA No current medications, refuses these. Saw Dr. Kirke CorinArida yesterday for her paroxysmal atrial fibrillation, she has declined anticoagulation.   Satisfied with current treatment? yes Duration of hypertension: chronic BP monitoring frequency: daily BP range: 120-130/70-80's often at home BP medication side effects: no Duration of hyperlipidemia: chronic Aspirin: no Recent stressors: no Recurrent headaches: no Visual changes: no Palpitations: no Dyspnea: no Chest pain: no Lower extremity edema: no Dizzy/lightheaded: no   URGE INCONTINENCE: Takes Oxybutynin 15 MG at bedtime daily. Reports continues to get up to bathroom a few times a night.  Does endorse when she gets up to bathroom she drinks a glass of water.  At baseline drinks a lot of water during daytime.  Denies UTI symptoms at this time.  No polyuria, polydipsia, polyphagia.     Relevant past medical, surgical, family and social history reviewed and updated as indicated. Interim medical history since our last visit reviewed. Allergies and medications reviewed and updated.  Review of Systems  Constitutional: Negative for activity change,  appetite change, diaphoresis, fatigue and fever.  Respiratory: Negative for cough, chest tightness and shortness of breath.   Cardiovascular: Negative for chest pain, palpitations and leg swelling.  Gastrointestinal: Negative for abdominal distention, abdominal pain, constipation, diarrhea, nausea and vomiting.  Neurological: Negative for dizziness, syncope, weakness, light-headedness, numbness and headaches.  Psychiatric/Behavioral: Negative.     Per HPI unless specifically indicated above     Objective:    BP 132/85   Pulse 85   Temp 98.7 F (37.1 C) (Oral)   Ht 5\' 1"  (1.549 m)   Wt 189 lb (85.7 kg)   SpO2 99%   BMI 35.71 kg/m   Wt Readings from Last 3 Encounters:  02/05/19 189 lb (85.7 kg)  11/15/18 190 lb (86.2 kg)  11/01/18 192 lb (87.1 kg)    Physical Exam Vitals signs and nursing note reviewed.  Constitutional:      General: She is awake. She is not in acute distress.    Appearance: She is well-developed. She is obese. She is not ill-appearing.  HENT:     Head: Normocephalic.     Right Ear: Hearing normal.     Left Ear: Hearing normal.  Eyes:     General: Lids are normal.        Right eye: No discharge.        Left eye: No discharge.     Conjunctiva/sclera: Conjunctivae normal.     Pupils: Pupils are equal, round, and reactive to light.  Neck:  Musculoskeletal: Normal range of motion and neck supple.     Thyroid: No thyromegaly.     Vascular: No carotid bruit.  Cardiovascular:     Rate and Rhythm: Normal rate and regular rhythm.     Heart sounds: Normal heart sounds. No murmur. No gallop.   Pulmonary:     Effort: Pulmonary effort is normal. No accessory muscle usage or respiratory distress.     Breath sounds: Normal breath sounds.  Abdominal:     General: Bowel sounds are normal.     Palpations: Abdomen is soft.  Musculoskeletal:     Right lower leg: Edema (trace) present.     Left lower leg: Edema (trace) present.  Lymphadenopathy:     Cervical:  No cervical adenopathy.  Skin:    General: Skin is warm and dry.  Neurological:     Mental Status: She is alert and oriented to person, place, and time.  Psychiatric:        Attention and Perception: Attention normal.        Mood and Affect: Mood normal.        Behavior: Behavior normal. Behavior is cooperative.        Thought Content: Thought content normal.        Judgment: Judgment normal.     Results for orders placed or performed in visit on 05/15/17  Comprehensive metabolic panel  Result Value Ref Range   Glucose 84 65 - 99 mg/dL   BUN 14 8 - 27 mg/dL   Creatinine, Ser 0.74 0.57 - 1.00 mg/dL   GFR calc non Af Amer 76 >59 mL/min/1.73   GFR calc Af Amer 87 >59 mL/min/1.73   BUN/Creatinine Ratio 19 12 - 28   Sodium 138 134 - 144 mmol/L   Potassium 4.6 3.5 - 5.2 mmol/L   Chloride 104 96 - 106 mmol/L   CO2 21 20 - 29 mmol/L   Calcium 9.3 8.7 - 10.3 mg/dL   Total Protein 7.4 6.0 - 8.5 g/dL   Albumin 4.3 3.5 - 4.7 g/dL   Globulin, Total 3.1 1.5 - 4.5 g/dL   Albumin/Globulin Ratio 1.4 1.2 - 2.2   Bilirubin Total 0.3 0.0 - 1.2 mg/dL   Alkaline Phosphatase 83 39 - 117 IU/L   AST 23 0 - 40 IU/L   ALT 16 0 - 32 IU/L  Lipid Panel w/o Chol/HDL Ratio  Result Value Ref Range   Cholesterol, Total 199 100 - 199 mg/dL   Triglycerides 101 0 - 149 mg/dL   HDL 58 >39 mg/dL   VLDL Cholesterol Cal 20 5 - 40 mg/dL   LDL Calculated 121 (H) 0 - 99 mg/dL  Alpha Gal IgE  Result Value Ref Range   Alpha Gal IgE* <0.10 <0.10 kU/L      Assessment & Plan:   Problem List Items Addressed This Visit      Cardiovascular and Mediastinum   Essential hypertension, benign    Chronic, well controlled without medications.  She decline treatments.  Continue to collaborate with cardiology.      PAF (paroxysmal atrial fibrillation) (HCC)    Chronic, well controlled without medications.  She declines treatment.  Continue to collaborate with cardiology.        Musculoskeletal and Integument    Osteoarthritis    Chronic, stable.  Refuses PT.  Continue simple treatment at home.      Relevant Medications   meloxicam (MOBIC) 15 MG tablet     Other   Hypercholesteremia  Chronic, ongoing.  Refuses statin and refuses labs today.  Continue to monitor.      Urge incontinence of urine    Chronic, ongoing.  Consider reduction of Oxybutynin in future and educated on Kegel exercises and decreasing water intake at night.  Return in 6 months.      Relevant Medications   oxybutynin (DITROPAN XL) 15 MG 24 hr tablet       Follow up plan: Return in about 6 months (around 08/08/2019) for HTN/HLD, Osteoarthritis.

## 2019-02-05 NOTE — Assessment & Plan Note (Signed)
Chronic, well controlled without medications.  She declines treatment.  Continue to collaborate with cardiology.

## 2019-02-11 ENCOUNTER — Telehealth: Payer: Self-pay | Admitting: Nurse Practitioner

## 2019-02-11 NOTE — Chronic Care Management (AMB) (Signed)
°  Chronic Care Management   Outreach Note  02/11/2019 Name: Makila Colombe MRN: [id number] DOB: 1933-04-15  Referred by: Venita Lick, NP Reason for referral : Chronic Care Management (Initial CCM outreach was unsuccessful. )   An unsuccessful telephone outreach was attempted today. The patient was referred to the case management team by for assistance with chronic care management and care coordination.   Follow Up Plan: The care management team will reach out to the patient again over the next 7 days.   [place]  [email or web address]   ??[phone]

## 2019-02-17 NOTE — Chronic Care Management (AMB) (Signed)
°  Chronic Care Management   Outreach Note  02/17/2019 Name: Holly Lewis MRN: [id number] DOB: 10-Nov-1932  Referred by: Venita Lick, NP Reason for referral : Chronic Care Management (Initial CCM outreach was unsuccessful. ) and Chronic Care Management (Second CCM outreach was unsuccessful.)   A second unsuccessful telephone outreach was attempted today. The patient was referred to the case management team for assistance with chronic care management and care coordination.   Follow Up Plan: A HIPPA compliant phone message was left for the patient providing contact information and requesting a return call.  The care management team will reach out to the patient again over the next 7 days.  If patient returns call to provider office, please advise to call [place] [facility] [place]  [email or web address]   ??[phone]

## 2019-02-19 NOTE — Chronic Care Management (AMB) (Signed)
°  Chronic Care Management   Outreach Note  02/19/2019 Name: Holly Lewis MRN: [id number] DOB: 1933/02/11  Referred by: Venita Lick, NP Reason for referral : Chronic Care Management (Initial CCM outreach was unsuccessful. ), Chronic Care Management (Second CCM outreach was unsuccessful.), and Chronic Care Management (Third CCM outreach was unsuccessful. )   Third unsuccessful telephone outreach was attempted today. The patient was referred to the case management team for assistance with chronic care management and care coordination. The patient's primary care provider has been notified of our unsuccessful attempts to make or maintain contact with the patient. The care management team is pleased to engage with this patient at any time in the future should he/she be interested in assistance from the care management team.   Follow Up Plan: The care management team is available to follow up with the patient after provider conversation with the patient regarding recommendation for care management engagement and subsequent re-referral to the care management team.   [place]  [email or web address]   ??[phone]

## 2019-03-03 ENCOUNTER — Other Ambulatory Visit: Payer: Self-pay

## 2019-03-03 MED ORDER — MELOXICAM 15 MG PO TABS: 7.5000 mg | ORAL_TABLET | Freq: Every day | ORAL | 1 refills | Status: DC | PRN

## 2019-03-03 NOTE — Telephone Encounter (Signed)
Patient is requesting a 90 day supply of meloxicam and oxybutynin  to be sent to [facility]

## 2019-07-04 ENCOUNTER — Telehealth (INDEPENDENT_AMBULATORY_CARE_PROVIDER_SITE_OTHER): Payer: Medicare HMO | Admitting: Physician Assistant

## 2019-07-04 ENCOUNTER — Other Ambulatory Visit: Payer: Self-pay

## 2019-07-04 ENCOUNTER — Encounter: Payer: Self-pay | Admitting: Physician Assistant

## 2019-07-04 VITALS — Ht 61.0 in | Wt 188.0 lb

## 2019-07-04 DIAGNOSIS — E7800 Pure hypercholesterolemia, unspecified: Secondary | ICD-10-CM | POA: Diagnosis not present

## 2019-07-04 DIAGNOSIS — R079 Chest pain, unspecified: Secondary | ICD-10-CM | POA: Diagnosis not present

## 2019-07-04 DIAGNOSIS — I480 Paroxysmal atrial fibrillation: Secondary | ICD-10-CM | POA: Diagnosis not present

## 2019-07-04 DIAGNOSIS — I10 Essential (primary) hypertension: Secondary | ICD-10-CM

## 2019-07-04 NOTE — Progress Notes (Signed)
Virtual Visit via Telephone Note   This visit type was conducted due to national recommendations for restrictions regarding the COVID-19 Pandemic (e.g. social distancing) in an effort to limit this patient's exposure and mitigate transmission in our community.  Due to her co-morbid illnesses, this patient is at least at moderate risk for complications without adequate follow up.  This format is felt to be most appropriate for this patient at this time.  The patient did not have access to video technology/had technical difficulties with video requiring transitioning to audio format only (telephone).  All issues noted in this document were discussed and addressed.  No physical exam could be performed with this format.  Please refer to the patient's chart for her  consent to telehealth for [facility].   Date:  07/04/2019   ID:  Holly Lewis, DOB 03-May-1933, MRN [id number]  Patient Location: Home Provider Location: Office  PCP:  Marjie Skiff, NP  Cardiologist:  Lorine Bears, MD  Electrophysiologist:  None   Evaluation Performed:  Follow-Up Visit  Chief Complaint:  86 yo female with history of PAF, HTN, HLD, CP, anxiety, and cataracts, and presents today via telehealth visit for 6 month  follow-up on PAF.   History of Present Illness:    Holly Lewis is a 86 y.o. female with PMH as above. She underwent stress testing in 2016 that was normal. She was diagnosed with PAF in 2017 and underwent TEE/DCCV at that time. She stopped Eliquis due to inability to afford the medication and did not wish to go on any alternative medications. Per review of EMR, she has not had any documented arrhythmia since she has been following with [facility]. She was last seen via Telemedicine visit for PAF 11/15/2018.   Today, she reports that she has been doing well from a cardiac standpoint. She denies any CP, racing HR, or palpitations. No SOB, DOE, PND, orthopnea, abdominal distention, LEE, or abdominal  distention. She declines any form of anticoagulation at this time. She is aware of risk of stroke associated with Afib when not on anticoagulation. She does not check her BP; however, on review of her most recent NP office visit, BP was 132/85 with a HR of 85bpm. She declines any antihypertensives / rate controlling medications at this time. Her main complaint is that of arthritis and aches and pains, mainly in her knee. She tries to remain active around the house doing her usual chores.   The patient does not have symptoms concerning for COVID-19 infection (fever, chills, cough, or new shortness of breath).    Past Medical History:  Diagnosis Date  . Anxiety   . Blood transfusion without reported diagnosis   . Cataract   . Chest pain    a. Pt reports nl stress test in 2016 in [place]; b. 05/2017 declined stress test for recurrent c/p.  Marland Kitchen Hyperlipidemia   . Hypertension   . PAF (paroxysmal atrial fibrillation) (HCC)    a. Dx in 2017 in [place] per pt-->s/p TEE/DCCV;  b. CHA2DS2VASc = 4-->Eliquis.   Past Surgical History:  Procedure Laterality Date  . CHOLECYSTECTOMY    . EYE SURGERY    . 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 kidney stones       Current Meds  Medication Sig  . doxepin (SINEQUAN) 10 MG capsule Take 1 capsule (10 mg total) by mouth at bedtime.  . meloxicam (MOBIC) 15 MG tablet Take  0.5 tablets (7.5 mg total) by mouth daily as needed.  Marland Kitchen oxybutynin (DITROPAN XL) 15 MG 24 hr tablet Take 1 tablet (15 mg total) by mouth at bedtime.     Allergies:   Diltiazem and Latex   Social History   Tobacco Use  . Smoking status: Never Smoker  . Smokeless tobacco: Never Used  Substance Use Topics  . Alcohol use: No  . Drug use: No     Family Hx: The patient's family history includes Alzheimer's disease in her sister; Cancer in her brother and sister; Diabetes in her brother, mother, and sister.  ROS:   Please see the history of present  illness.    She denies chest pain, palpitations, dyspnea, pnd, orthopnea, n, v, dizziness, syncope, edema, weight gain, or early satiety.  All other systems reviewed and are negative.   Prior CV studies:   The following studies were reviewed today:  None available.  Labs/Other Tests and Data Reviewed:    EKG:  No ECG reviewed.  Recent Labs: No results found for requested labs within last 8760 hours.   Recent Lipid Panel Lab Results  Component Value Date/Time   CHOL 199 05/15/2017 09:29 AM   TRIG 101 05/15/2017 09:29 AM   HDL 58 05/15/2017 09:29 AM   LDLCALC 121 (H) 05/15/2017 09:29 AM    Wt Readings from Last 3 Encounters:  07/04/19 188 lb (85.3 kg)  02/05/19 189 lb (85.7 kg)  11/15/18 190 lb (86.2 kg)     Objective:    Vital Signs:  Ht 5\' 1"  (1.549 m)   Wt 188 lb (85.3 kg)   BMI 35.52 kg/m    VITAL SIGNS:  reviewed  ASSESSMENT & PLAN:    PAF --Asymptomatic. No racing HR or palpitations. Declines any anticoagulation or rate controlling medications at this time. We did review the risk of stroke off of anticoagulation given CHA2DS2VASc score of at least  4.   HTN --Per PCP. Last clinic BP elevated with patient declining any treatment at this time. Recommend home monitoring of BP.  HLD --Lipid labs deferred to PCP. Not on a statin.  COVID-19 Education: The signs and symptoms of COVID-19 were discussed with the patient and how to seek care for testing (follow up with PCP or arrange E-visit).  The importance of social distancing was discussed today.  Time:   Today, I have spent 10 minutes with the patient with telehealth technology discussing the above problems.     Medication Adjustments/Labs and Tests Ordered: Current medicines are reviewed at length with the patient today.  Concerns regarding medicines are outlined above.   Tests Ordered: No orders of the defined types were placed in this encounter.   Medication Changes: No orders of the defined types  were placed in this encounter.   Follow Up:  Virtual Visit  in 6 month(s)  Signed, Arvil Chaco, PA-C  07/04/2019 1:27 PM    [facility]

## 2019-07-04 NOTE — Patient Instructions (Signed)
Medication Instructions:  None ordered  *If you need a refill on your cardiac medications before your next appointment, please call your pharmacy*  Lab Work: None ordered If you have labs (blood work) drawn today and your tests are completely normal, you will receive your results only by: . MyChart Message (if you have MyChart) OR . A paper copy in the mail If you have any lab test that is abnormal or we need to change your treatment, we will call you to review the results.  Testing/Procedures: None ordered  Follow-Up: At [facility], you and your health needs are our priority.  As part of our continuing mission to provide you with exceptional heart care, we have created designated Provider Care Teams.  These Care Teams include your primary Cardiologist (physician) and Advanced Practice Providers (APPs -  Physician Assistants and Nurse Practitioners) who all work together to provide you with the care you need, when you need it.  Your next appointment:   6 month(s)  The format for your next appointment:   In Person  Provider:    You may see Muhammad Arida, MD or one of the following Advanced Practice Providers on your designated Care Team:    Christopher Berge, NP  Ryan Dunn, PA-C  Jacquelyn Visser, PA-C   Other Instructions NA  

## 2019-07-31 ENCOUNTER — Ambulatory Visit: Payer: Medicare HMO

## 2019-08-11 ENCOUNTER — Telehealth: Payer: Self-pay | Admitting: Nurse Practitioner

## 2019-08-11 ENCOUNTER — Ambulatory Visit (INDEPENDENT_AMBULATORY_CARE_PROVIDER_SITE_OTHER): Payer: Medicare HMO

## 2019-08-11 VITALS — Ht 61.0 in | Wt 191.0 lb

## 2019-08-11 DIAGNOSIS — Z0000 Encounter for general adult medical examination without abnormal findings: Secondary | ICD-10-CM | POA: Diagnosis not present

## 2019-08-11 NOTE — Progress Notes (Signed)
Subjective:   Holly Lewis is a 86 y.o. female who presents for Medicare Annual/Subsequent preventive examination.  This visit is being conducted via phone call  - after an attmept to do on video chat - due to the COVID-19 pandemic. This patient has given me verbal consent via phone to conduct this visit, patient states they are participating from their home address. Some vital signs may be absent or patient reported.   Patient identification: identified by name, DOB, and current address.    Review of Systems:  Cardiac Risk Factors include: advanced age (>35men, >5 women)     Objective:    Vitals: Ht 5\' 1"  (1.549 m)    Wt 191 lb (86.6 kg)    BMI 36.09 kg/m   Body mass index is 36.09 kg/m.  Advanced Directives 08/11/2019 07/29/2018 05/23/2018 07/25/2017  Does Patient Have a Medical Advance Directive? No No Yes Yes  Type of Advance Directive - - Living will;Healthcare Power of [place];Living will  Does patient want to make changes to medical advance directive? - - No - Patient declined -  Copy of [place] in Chart? - - No - copy requested Yes  Would patient like information on creating a medical advance directive? - No - Patient declined - -    Tobacco Social History   Tobacco Use  Smoking Status Never Smoker  Smokeless Tobacco Never Used     Counseling given: Not Answered   Clinical Intake:  Pre-visit preparation completed: Yes  Pain : No/denies pain     Nutritional Status: BMI > 30  Obese Nutritional Risks: None Diabetes: No  How often do you need to have someone help you when you read instructions, pamphlets, or other written materials from your doctor or pharmacy?: 1 - Never  Interpreter Needed?: No  Information entered by :: Gloriana Piltz,LPN  Past Medical History:  Diagnosis Date   Anxiety    Blood transfusion without reported diagnosis    Cataract    Chest pain    a. Pt reports nl stress test in  2016 in [place]; b. 05/2017 declined stress test for recurrent c/p.   Hyperlipidemia    Hypertension    PAF (paroxysmal atrial fibrillation) ([place])    a. Dx in 2017 in [place] per pt-->s/p TEE/DCCV;  b. CHA2DS2VASc = 4-->Eliquis.   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   kidney stones     Family History  Problem Relation Age of Onset   Diabetes Mother    Diabetes Sister    Cancer Sister        breast   Alzheimer's disease Sister    Diabetes Brother    Cancer Brother        blood   Social History   Socioeconomic History   Marital status: Widowed    Spouse name: Not on file   Number of children: Not on file   Years of education: Not on file   Highest education level: Bachelor's degree (e.g., BA, AB, BS)  Occupational History   Occupation: retired  Tobacco Use   Smoking status: Never Smoker   Smokeless tobacco: Never Used  Substance and Sexual Activity   Alcohol use: No   Drug use: No   Sexual activity: Never  Other Topics Concern   Not  on file  Social History Narrative   Not on file   Social Determinants of Health   Financial Resource Strain:    Difficulty of Paying Living Expenses: Not on file  Food Insecurity:    Worried About Programme researcher, broadcasting/film/video in the Last Year: Not on file   [employer] of Food in the Last Year: Not on file  Transportation Needs:    Lack of Transportation (Medical): Not on file   Lack of Transportation (Non-Medical): Not on file  Physical Activity:    Days of Exercise per Week: Not on file   Minutes of Exercise per Session: Not on file  Stress:    Feeling of Stress : Not on file  Social Connections:    Frequency of Communication with Friends and Family: Not on file   Frequency of Social Gatherings with Friends and Family: Not on file   Attends Religious Services: Not on file   Active  Member of Clubs or Organizations: Not on file   Attends Club or Organization Meetings: Not on file   Marital Status: Not on file    Outpatient Encounter Medications as of 08/11/2019  Medication Sig   doxepin (SINEQUAN) 10 MG capsule Take 1 capsule (10 mg total) by mouth at bedtime.   meloxicam (MOBIC) 15 MG tablet Take 0.5 tablets (7.5 mg total) by mouth daily as needed.   oxybutynin (DITROPAN XL) 15 MG 24 hr tablet Take 1 tablet (15 mg total) by mouth at bedtime.   No facility-administered encounter medications on file as of 08/11/2019.    Activities of Daily Living In your present state of health, do you have any difficulty performing the following activities: 08/11/2019  Hearing? N  Vision? N  Comment no eye dr. Tilman Neat  Difficulty concentrating or making decisions? Y  Walking or climbing stairs? N  Dressing or bathing? N  Doing errands, shopping? Y  Preparing Food and eating ? N  Using the Toilet? N  In the past six months, have you accidently leaked urine? N  Do you have problems with loss of bowel control? N  Managing your Medications? N  Managing your Finances? N  Housekeeping or managing your Housekeeping? N  Some recent data might be hidden    Patient Care Team: Marjie Skiff, NP as PCP - General (Nurse Practitioner) [place] Ouch, MD as PCP - Cardiology (Cardiology) [place] Ouch, MD as Consulting Physician (Cardiology)   Assessment:   This is a routine wellness examination for Holly Lewis.  Exercise Activities and Dietary recommendations Current Exercise Habits: Home exercise routine, Type of exercise: walking, Time (Minutes): 30, Frequency (Times/Week): 5, Weekly Exercise (Minutes/Week): 150, Intensity: Mild, Exercise limited by: None identified  Goals     DIET - INCREASE WATER INTAKE     Recommend drinking at least 6-8 glasses of water a day        Fall Risk: Fall Risk  08/11/2019 07/29/2018 07/25/2017  Falls in the past year? 0 0 Yes  Number  falls in past yr: 0 0 1  Injury with Fall? 0 0 No    FALL RISK PREVENTION PERTAINING TO THE HOME:  Any stairs in or around the home? No  If so, are there any without handrails? No   Home free of loose throw rugs in walkways, pet beds, electrical cords, etc? Yes  Adequate lighting in your home to reduce risk of falls? Yes   ASSISTIVE DEVICES UTILIZED TO PREVENT FALLS:  Life alert? No  Use of  a cane, walker or w/c? Yes  cane  Grab bars in the bathroom? No  Shower chair or bench in shower? Yes  Elevated toilet seat or a handicapped toilet? Yes   TIMED UP AND GO:  Unable to perform   Depression Screen PHQ 2/9 Scores 08/11/2019 07/29/2018 11/14/2017 07/25/2017  PHQ - 2 Score 0 1 0 0  PHQ- 9 Score - - 3 -    Cognitive Function     6CIT Screen 07/29/2018 07/25/2017  What Year? 0 points 0 points  What month? 0 points 0 points  What time? 0 points 0 points  Count back from 20 0 points 0 points  Months in reverse 0 points 0 points  Repeat phrase 2 points 0 points  Total Score 2 0    Immunization History  Administered Date(s) Administered   Influenza, High Dose Seasonal PF 04/02/2017, 05/01/2018   Pneumococcal Conjugate-13 11/18/2014   Pneumococcal Polysaccharide-23 06/19/2006    Qualifies for Shingles Vaccine? Yes  Zostavax completed n/a . Due for Shingrix. Education has been provided regarding the importance of this vaccine. Pt has been advised to call insurance company to determine out of pocket expense. Advised may also receive vaccine at local pharmacy or Health Dept. Verbalized acceptance and understanding.  Tdap: Discussed need for TD/TDAP vaccine, patient verbalized understanding that this is not covered as a preventative with there insurance and to call the office if she develops any new skin injuries, ie: cuts, scrapes, bug bites, or open wounds.  Flu Vaccine: due   Pneumococcal Vaccine: up to date   Covid-19:information provided   Screening Tests Health  Maintenance  Topic Date Due   INFLUENZA VACCINE  01/18/2019   TETANUS/TDAP  08/10/2020 (Originally 08/13/1951)   DEXA SCAN  Completed   PNA vac Low Risk Adult  Completed   Cancer Screenings:  Colorectal Screening: no longer required   Lung Cancer Screening: (Low Dose CT Chest recommended if Age 58-80 years, 30 pack-year currently smoking OR have quit w/in 15years.) does not qualify.     Additional Screening:  Hepatitis C Screening: does not qualify  Vision Screening: Recommended annual ophthalmology exams for early detection of glaucoma and other disorders of the eye. Is the patient up to date with their annual eye exam?  Yes  Who is the provider or what is the name of the office in which the pt attends annual eye exams?   Dental Screening: Recommended annual dental exams for proper oral hygiene  Community Resource Referral:  CRR required this visit?  Yes transportation       Plan:  I have personally reviewed and addressed the Medicare Annual Wellness questionnaire and have noted the following in the patients chart:  A. Medical and social history B. Use of alcohol, tobacco or illicit drugs  C. Current medications and supplements D. Functional ability and status E.  Nutritional status F.  Physical activity G. Advance directives H. List of other physicians I.  Hospitalizations, surgeries, and ER visits in previous 12 months J.  Vitals K. Screenings such as hearing and vision if needed, cognitive and depression L. Referrals and appointments   In addition, I have reviewed and discussed with patient certain preventive protocols, quality metrics, and best practice recommendations. A written personalized care plan for preventive services as well as general preventive health recommendations were provided to patient.   Signed,   Collene Schlichter, LPN  1/94/1740 Nurse Health Advisor   Nurse Notes: none

## 2019-08-11 NOTE — Telephone Encounter (Signed)
   08/11/2019  Name: Holly Lewis   MRN: [id number]   DOB: 1932/12/27   AGE: 86 y.o.   GENDER: female   PCP Marjie Skiff, NP.   Called pt regarding [employer] Referral for transportation. Pt stated that she uses [employer] [phone] Her last trip was cancelled due to weather and she stated that she will c/b to sched for Wed trip with Jolene. Pt was not aware that [employer] was a benefit through her [facility] Provider, gave her information for 800 #.     Manuela Schwartz  Care Guide . Embedded Care Coordination [facility] Management [email or web address] [email or web address]  [phone]

## 2019-08-11 NOTE — Patient Instructions (Signed)
Holly Lewis , Thank you for taking time to come for your Medicare Wellness Visit. I appreciate your ongoing commitment to your health goals. Please review the following plan we discussed and let me know if I can assist you in the future.   Screening recommendations/referrals: Colonoscopy: no longer required Mammogram: no longer required Bone Density: no longer required  Recommended yearly ophthalmology/optometry visit for glaucoma screening and checkup Recommended yearly dental visit for hygiene and checkup  Vaccinations: Influenza vaccine: up to date Pneumococcal vaccine: up to date  Tdap vaccine: up to date  Shingles vaccine: shingrix eligible   Advanced directives: Advance directive discussed with you today.Once this is complete please bring a copy in to our office so we can scan it into your chart.  Conditions/risks identified: fall risk,  preventions discussed.  Next appointment: Follow up in one year annual wellness visit.    Preventive Care 46 Years and Older, Female Preventive care refers to lifestyle choices and visits with your health care provider that can promote health and wellness. What does preventive care include?  A yearly physical exam. This is also called an annual well check.  Dental exams once or twice a year.  Routine eye exams. Ask your health care provider how often you should have your eyes checked.  Personal lifestyle choices, including:  Daily care of your teeth and gums.  Regular physical activity.  Eating a healthy diet.  Avoiding tobacco and drug use.  Limiting alcohol use.  Practicing safe sex.  Taking low-dose aspirin every day.  Taking vitamin and mineral supplements as recommended by your health care provider. What happens during an annual well check? The services and screenings done by your health care provider during your annual well check will depend on your age, overall health, lifestyle risk factors, and family history of  disease. Counseling  Your health care provider may ask you questions about your:  Alcohol use.  Tobacco use.  Drug use.  Emotional well-being.  Home and relationship well-being.  Sexual activity.  Eating habits.  History of falls.  Memory and ability to understand (cognition).  Work and work Astronomer.  Reproductive health. Screening  You may have the following tests or measurements:  Height, weight, and BMI.  Blood pressure.  Lipid and cholesterol levels. These may be checked every 5 years, or more frequently if you are over 27 years old.  Skin check.  Lung cancer screening. You may have this screening every year starting at age 67 if you have a 30-pack-year history of smoking and currently smoke or have quit within the past 15 years.  Fecal occult blood test (FOBT) of the stool. You may have this test every year starting at age 62.  Flexible sigmoidoscopy or colonoscopy. You may have a sigmoidoscopy every 5 years or a colonoscopy every 10 years starting at age 26.  Hepatitis C blood test.  Hepatitis B blood test.  Sexually transmitted disease (STD) testing.  Diabetes screening. This is done by checking your blood sugar (glucose) after you have not eaten for a while (fasting). You may have this done every 1-3 years.  Bone density scan. This is done to screen for osteoporosis. You may have this done starting at age 86.  Mammogram. This may be done every 1-2 years. Talk to your health care provider about how often you should have regular mammograms. Talk with your health care provider about your test results, treatment options, and if necessary, the need for more tests. Vaccines  Your health  care provider may recommend certain vaccines, such as:  Influenza vaccine. This is recommended every year.  Tetanus, diphtheria, and acellular pertussis (Tdap, Td) vaccine. You may need a Td booster every 10 years.  Zoster vaccine. You may need this after age  81.  Pneumococcal 13-valent conjugate (PCV13) vaccine. One dose is recommended after age 1.  Pneumococcal polysaccharide (PPSV23) vaccine. One dose is recommended after age 45. Talk to your health care provider about which screenings and vaccines you need and how often you need them. This information is not intended to replace advice given to you by your health care provider. Make sure you discuss any questions you have with your health care provider. Document Released: 07/02/2015 Document Revised: 02/23/2016 Document Reviewed: 04/06/2015 Elsevier Interactive Patient Education  2017 [place] Prevention in the Home Falls can cause injuries. They can happen to people of all ages. There are many things you can do to make your home safe and to help prevent falls. What can I do on the outside of my home?  Regularly fix the edges of walkways and driveways and fix any cracks.  Remove anything that might make you trip as you walk through a door, such as a raised step or threshold.  Trim any bushes or trees on the path to your home.  Use bright outdoor lighting.  Clear any walking paths of anything that might make someone trip, such as rocks or tools.  Regularly check to see if handrails are loose or broken. Make sure that both sides of any steps have handrails.  Any raised decks and porches should have guardrails on the edges.  Have any leaves, snow, or ice cleared regularly.  Use sand or salt on walking paths during winter.  Clean up any spills in your garage right away. This includes oil or grease spills. What can I do in the bathroom?  Use night lights.  Install grab bars by the toilet and in the tub and shower. Do not use towel bars as grab bars.  Use non-skid mats or decals in the tub or shower.  If you need to sit down in the shower, use a plastic, non-slip stool.  Keep the floor dry. Clean up any water that spills on the floor as soon as it happens.  Remove  soap buildup in the tub or shower regularly.  Attach bath mats securely with double-sided non-slip rug tape.  Do not have throw rugs and other things on the floor that can make you trip. What can I do in the bedroom?  Use night lights.  Make sure that you have a light by your bed that is easy to reach.  Do not use any sheets or blankets that are too big for your bed. They should not hang down onto the floor.  Have a firm chair that has side arms. You can use this for support while you get dressed.  Do not have throw rugs and other things on the floor that can make you trip. What can I do in the kitchen?  Clean up any spills right away.  Avoid walking on wet floors.  Keep items that you use a lot in easy-to-reach places.  If you need to reach something above you, use a strong step stool that has a grab bar.  Keep electrical cords out of the way.  Do not use floor polish or wax that makes floors slippery. If you must use wax, use non-skid floor wax.  Do not have  throw rugs and other things on the floor that can make you trip. What can I do with my stairs?  Do not leave any items on the stairs.  Make sure that there are handrails on both sides of the stairs and use them. Fix handrails that are broken or loose. Make sure that handrails are as long as the stairways.  Check any carpeting to make sure that it is firmly attached to the stairs. Fix any carpet that is loose or worn.  Avoid having throw rugs at the top or bottom of the stairs. If you do have throw rugs, attach them to the floor with carpet tape.  Make sure that you have a light switch at the top of the stairs and the bottom of the stairs. If you do not have them, ask someone to add them for you. What else can I do to help prevent falls?  Wear shoes that:  Do not have high heels.  Have rubber bottoms.  Are comfortable and fit you well.  Are closed at the toe. Do not wear sandals.  If you use a  stepladder:  Make sure that it is fully opened. Do not climb a closed stepladder.  Make sure that both sides of the stepladder are locked into place.  Ask someone to hold it for you, if possible.  Clearly mark and make sure that you can see:  Any grab bars or handrails.  First and last steps.  Where the edge of each step is.  Use tools that help you move around (mobility aids) if they are needed. These include:  Canes.  Walkers.  Scooters.  Crutches.  Turn on the lights when you go into a dark area. Replace any light bulbs as soon as they burn out.  Set up your furniture so you have a clear path. Avoid moving your furniture around.  If any of your floors are uneven, fix them.  If there are any pets around you, be aware of where they are.  Review your medicines with your doctor. Some medicines can make you feel dizzy. This can increase your chance of falling. Ask your doctor what other things that you can do to help prevent falls. This information is not intended to replace advice given to you by your health care provider. Make sure you discuss any questions you have with your health care provider. Document Released: 04/01/2009 Document Revised: 11/11/2015 Document Reviewed: 07/10/2014 Elsevier Interactive Patient Education  2017 [employer].

## 2019-08-13 ENCOUNTER — Other Ambulatory Visit: Payer: Self-pay

## 2019-08-13 ENCOUNTER — Encounter: Payer: Self-pay | Admitting: Nurse Practitioner

## 2019-08-13 ENCOUNTER — Ambulatory Visit (INDEPENDENT_AMBULATORY_CARE_PROVIDER_SITE_OTHER): Payer: Medicare HMO | Admitting: Nurse Practitioner

## 2019-08-13 VITALS — BP 136/86 | HR 76 | Temp 97.9°F

## 2019-08-13 DIAGNOSIS — I480 Paroxysmal atrial fibrillation: Secondary | ICD-10-CM | POA: Diagnosis not present

## 2019-08-13 DIAGNOSIS — N3941 Urge incontinence: Secondary | ICD-10-CM | POA: Diagnosis not present

## 2019-08-13 DIAGNOSIS — I10 Essential (primary) hypertension: Secondary | ICD-10-CM | POA: Diagnosis not present

## 2019-08-13 DIAGNOSIS — L2089 Other atopic dermatitis: Secondary | ICD-10-CM

## 2019-08-13 DIAGNOSIS — L989 Disorder of the skin and subcutaneous tissue, unspecified: Secondary | ICD-10-CM | POA: Diagnosis not present

## 2019-08-13 DIAGNOSIS — M8949 Other hypertrophic osteoarthropathy, multiple sites: Secondary | ICD-10-CM

## 2019-08-13 DIAGNOSIS — E7800 Pure hypercholesterolemia, unspecified: Secondary | ICD-10-CM | POA: Diagnosis not present

## 2019-08-13 DIAGNOSIS — F5101 Primary insomnia: Secondary | ICD-10-CM | POA: Diagnosis not present

## 2019-08-13 DIAGNOSIS — M159 Polyosteoarthritis, unspecified: Secondary | ICD-10-CM

## 2019-08-13 MED ORDER — CLOBETASOL PROPIONATE 0.05 % EX CREA: 1.0000 "application " | TOPICAL_CREAM | Freq: Two times a day (BID) | CUTANEOUS | 0 refills | Status: DC

## 2019-08-13 NOTE — Assessment & Plan Note (Signed)
Chronic, ongoing.  Refuses statin and refuses labs today.  Continue to monitor.

## 2019-08-13 NOTE — Assessment & Plan Note (Signed)
Chronic, stable.  Refuses PT.  Continue simple treatment at home and adjust as needed.   

## 2019-08-13 NOTE — Assessment & Plan Note (Signed)
Chronic, well controlled without medications, BP at goal in office and home for her age.  She declines treatment or labs today, appropriate based on her goals of care, she reports being content with her life and not wishing to extend it, when "time comes it comes".  Continue to collaborate with cardiology.

## 2019-08-13 NOTE — Patient Instructions (Signed)
Dermatitis atpica Atopic Dermatitis La dermatitis atpica es un trastorno de la piel que causa inflamacin. Es el tipo ms frecuente de eczema. El eczema es un grupo de afecciones de la piel que causan picazn, enrojecimiento e hinchazn. Esta afeccin, generalmente, empeora durante los meses fros del invierno y suele mejorar durante los meses clidos del verano. Los sntomas pueden variar de Neomia Dear persona a Educational psychologist. La dermatitis atpica, normalmente, comienza a manifestarse en la infancia y puede durar hasta la [place]. Esta afeccin no puede transmitirse de [place] persona a otra (no es contagiosa), pero es ms comn en las familias. Es posible que la dermatitis atpica no siempre sea visible. Cuando es visible, se habla de un brote. Cules son las causas? Se desconoce la causa exacta de esta afeccin. Algunos factores desencadenantes de los brotes pueden ser los siguientes:  Contacto con [place] cosa a la que es sensible o Best boy.  Librarian, academic.  Ciertos alimentos.  Clima extremadamente clido o fro.  Jabones y sustancias qumicas fuertes.  Aire seco.  Cloro. Qu incrementa el riesgo? Esta afeccin es ms probable que [place] en personas que tienen antecedentes personales o familiares de eczema, alergias, asma o fiebre del heno. Cules son los signos o los sntomas? Los sntomas de esta afeccin [employer] siguientes:  Piel seca y escamosa.  Erupcin roja y que pica.  Picazn, que puede ser muy intensa. Puede ocurrir antes de la erupcin en la piel. Esto puede dificultar el sueo.  Engrosamiento y Programmer, systems de la piel que pueden producirse con Museum/gallery conservator. Cmo se diagnostica? Esta afeccin se diagnostica en funcin de los sntomas, los antecedentes mdicos y un examen fsico. Cmo se trata? No hay cura para esta afeccin, pero los sntomas, normalmente, se pueden controlar. El tratamiento se centra en lo siguiente:  Controlar la picazn y el rascado. Probablemente, le receten  medicamentos, como antihistamnicos o cremas corticoesteroides.  Limitar la exposicin a las cosas a las que es sensible o Best boy (alrgenos).  Reconocer situaciones que causan estrs e idear un plan para controlarlo. Si la dermatitis atpica no mejora con medicamentos o si est presente en todo el cuerpo (diseminada), puede utilizarse un tratamiento con un tipo de luz especfico (fototerapia). Siga estas indicaciones en su casa: Cuidado de la piel   Mantenga la piel bien humectada. Al hacerlo, quedar hmeda y ayudar a prevenir la sequedad. ? Utilice lociones sin perfume que contengan vaselina. ? Evite las lociones que contienen alcohol o agua. Pueden secar la piel.  Tome baos o duchas de corta duracin (menos de 5 minutos) en agua tibia. No use agua caliente. ? Use jabones suaves y sin perfume para baarse. Evite el jabn y el bao de espuma. ? Aplique un humectante para la piel inmediatamente despus de un bao o una ducha.  No aplique nada sobre la piel sin Science writer a su mdico. Instrucciones generales  Vstase con ropa de algodn o mezcla de algodn. Vstase con ropas ligeras, ya que el calor aumenta la picazn.  Cuando lave la ropa, [address] [address] para eliminar todo el [place].  Evite cualquier factor desencadenante que pueda causar un brote.  Intente manejar el estrs.  Mantenga las uas cortas.  Evite rascarse. El rascado hace que la erupcin y la picazn empeoren. Tambin puede producir una infeccin en la piel (imptigo) debido a las lesiones cutneas causadas por el rascado.  Tome o aplquese los medicamentos de venta libre y Building control surveyor como se lo haya indicado el mdico.  Oceanographer a todas las  visitas de seguimiento como se lo haya indicado el mdico. Esto es importante.  No est cerca de personas que tengan herpes labial o ampollas febriles. Si se produce la infeccin, puede hacer que la dermatitis atpica empeore. Comunquese con un mdico  si:  La picazn le impide dormir.  La erupcin empeora o no mejora en el plazo de una semana despus de iniciar el tratamiento.  Tiene fiebre.  Aparece un brote despus de estar en contacto con alguien que tiene herpes labial o ampollas febriles. Solicite ayuda de inmediato si:  Tiene pus o costras amarillas en la zona de la erupcin. Resumen  Esta afeccin causa una erupcin roja que pica, y la piel est seca y escamosa.  El tratamiento se enfoca en controlar la picazn y el rascado, limitar la exposicin a cosas a las que es sensible o alrgico (alrgenos), reconocer situaciones que causan estrs e idear un plan para manejar el estrs.  Mantenga la piel bien humectada.  Tome baos o duchas de menos de 5 minutos y use agua tibia. No use agua caliente. Esta informacin no tiene como fin reemplazar el consejo del mdico. Asegrese de hacerle al mdico cualquier pregunta que tenga. Document Revised: 09/25/2016 Document Reviewed: 09/25/2016 Elsevier Patient Education  2020 Elsevier Inc.    

## 2019-08-13 NOTE — Progress Notes (Signed)
BP 136/86 (BP Location: Left Arm, Patient Position: Sitting, Cuff Size: Normal)   Pulse 76   Temp 97.9 F (36.6 C) (Oral)   SpO2 96%    Subjective:    Patient ID: Holly Lewis, female    DOB: September 14, 1932, 86 y.o.   MRN: [id number]  HPI: Holly Lewis is a 86 y.o. female  Chief Complaint  Patient presents with  . Hypertension  . Osteoarthritis  . Hyperlipidemia    HYPERTENSION / HYPERLIPIDEMIA/A-FIB No current medications, refuses these. Saw cardiology on 07/04/2019 for her paroxysmal atrial fibrillation, she has declined anticoagulation or treatments.   Satisfied with current treatment? yes Duration of hypertension: chronic BP monitoring frequency: daily BP range: 117/80's to 136/84's range BP medication side effects: no Duration of hyperlipidemia: chronic Aspirin: no Recent stressors: no Recurrent headaches: no Visual changes: no Palpitations: no Dyspnea: no Chest pain: no Lower extremity edema: no Dizzy/lightheaded: no   OSTEOARTHRITIS: Reports Meloxicam helps when needed, at baseline has aches and pains here and there. Takes Tylenol occasionally.  Is taking occasional Meloxicam. Her arthritis ranges from hips to knees, plus occasionally other places.  Does endorse being on the move often and working around house. Uses heat at times.  Has not been using creams. Does not wish to go to physical therapy.    URGE INCONTINENCE: Takes Oxybutynin 15 MG at bedtime daily. Reports continues to get up to bathroom at least once a night.  Does endorse when she gets up to bathroom she drinks a glass of water.  At baseline drinks a lot of water during daytime.  Denies UTI symptoms at this time.  No polyuria, polydipsia, polyphagia.    INSOMNIA Continues on Doxepin and Melatonin for insomnia with no ADR. States she sleeps very well. Duration: chronic Satisfied with sleep quality: yes Difficulty falling asleep: no Difficulty staying asleep: no Waking a few hours after sleep  onset: no Early morning awakenings: no Daytime hypersomnolence: no Wakes feeling refreshed: no Good sleep hygiene: yes Apnea: no Snoring: no Depressed/anxious mood: no Recent stress: no Restless legs/nocturnal leg cramps: no Chronic pain/arthritis: yes History of sleep study: no Treatments attempted: melatonin   RASH To bilateral hands for 3 months. States she had some on face at first, but this is gone and now only on hands.  Has two moles to face she reports have increased in size as well. Duration:  months  Location: hands  Itching: yes Burning: no Redness: yes Oozing: no Scaling: yes Blisters: no Painful: no Fevers: no Change in detergents/soaps/personal care products: no Recent illness: no Recent travel:no History of same: no Context: fluctuating Alleviating factors: hydrocortisone cream and lotion/moisturizer Treatments attempted:hydrocortisone cream and lotion/moisturizer Shortness of breath: no  Throat/tongue swelling: no Myalgias/arthralgias: no   Relevant past medical, surgical, family and social history reviewed and updated as indicated. Interim medical history since our last visit reviewed. Allergies and medications reviewed and updated.  Review of Systems  Constitutional: Negative for activity change, appetite change, diaphoresis, fatigue and fever.  Respiratory: Negative for cough, chest tightness and shortness of breath.   Cardiovascular: Negative for chest pain, palpitations and leg swelling.  Gastrointestinal: Negative.   Musculoskeletal: Positive for arthralgias.  Skin: Positive for rash.  Neurological: Negative.   Psychiatric/Behavioral: Negative.     Per HPI unless specifically indicated above     Objective:    BP 136/86 (BP Location: Left Arm, Patient Position: Sitting, Cuff Size: Normal)   Pulse 76   Temp 97.9 F (36.6 C) (Oral)  SpO2 96%   Wt Readings from Last 3 Encounters:  08/11/19 191 lb (86.6 kg)  07/04/19 188 lb (85.3 kg)   02/05/19 189 lb (85.7 kg)    Physical Exam Vitals and nursing note reviewed.  Constitutional:      General: She is awake. She is not in acute distress.    Appearance: She is well-developed and well-groomed. She is obese. She is not ill-appearing.  HENT:     Head: Normocephalic.     Right Ear: Hearing normal.     Left Ear: Hearing normal.  Eyes:     General: Lids are normal.        Right eye: No discharge.        Left eye: No discharge.     Conjunctiva/sclera: Conjunctivae normal.     Pupils: Pupils are equal, round, and reactive to light.  Neck:     Vascular: No carotid bruit.  Cardiovascular:     Rate and Rhythm: Normal rate and regular rhythm.     Heart sounds: Normal heart sounds. No murmur. No gallop.   Pulmonary:     Effort: Pulmonary effort is normal. No accessory muscle usage or respiratory distress.     Breath sounds: Normal breath sounds.  Abdominal:     General: Bowel sounds are normal.     Palpations: Abdomen is soft.  Musculoskeletal:     Cervical back: Normal range of motion and neck supple.     Right lower leg: No edema.     Left lower leg: No edema.  Skin:    General: Skin is warm and dry.     Findings: Rash present. Rash is scaling.          Comments: Bilateral dorsal aspect hands with mild flat erythema, dry. patchy with scaling around edges.  None to arms or palmar aspect hands.    Neurological:     Mental Status: She is alert and oriented to person, place, and time.  Psychiatric:        Attention and Perception: Attention normal.        Mood and Affect: Mood normal.        Speech: Speech normal.        Behavior: Behavior normal. Behavior is cooperative.        Thought Content: Thought content normal.     Results for orders placed or performed in visit on 05/15/17  Comprehensive metabolic panel  Result Value Ref Range   Glucose 84 65 - 99 mg/dL   BUN 14 8 - 27 mg/dL   Creatinine, Ser 0.74 0.57 - 1.00 mg/dL   GFR calc non Af Amer 76 >59  mL/min/1.73   GFR calc Af Amer 87 >59 mL/min/1.73   BUN/Creatinine Ratio 19 12 - 28   Sodium 138 134 - 144 mmol/L   Potassium 4.6 3.5 - 5.2 mmol/L   Chloride 104 96 - 106 mmol/L   CO2 21 20 - 29 mmol/L   Calcium 9.3 8.7 - 10.3 mg/dL   Total Protein 7.4 6.0 - 8.5 g/dL   Albumin 4.3 3.5 - 4.7 g/dL   Globulin, Total 3.1 1.5 - 4.5 g/dL   Albumin/Globulin Ratio 1.4 1.2 - 2.2   Bilirubin Total 0.3 0.0 - 1.2 mg/dL   Alkaline Phosphatase 83 39 - 117 IU/L   AST 23 0 - 40 IU/L   ALT 16 0 - 32 IU/L  Lipid Panel w/o Chol/HDL Ratio  Result Value Ref Range   Cholesterol, Total 199 100 -  199 mg/dL   Triglycerides 751 0 - 149 mg/dL   HDL 58 >02 mg/dL   VLDL Cholesterol Cal 20 5 - 40 mg/dL   LDL Calculated 585 (H) 0 - 99 mg/dL  Alpha Gal IgE  Result Value Ref Range   Alpha Gal IgE* <0.10 <0.10 kU/L      Assessment & Plan:   Problem List Items Addressed This Visit      Cardiovascular and Mediastinum   Essential hypertension, benign    Chronic, well controlled without medications, BP at goal in office and home for her age.  She declines treatment or labs today, appropriate based on her goals of care, she reports being content with her life and not wishing to extend it, when "time comes it comes".  Continue to collaborate with cardiology.       PAF (paroxysmal atrial fibrillation) (HCC) - Primary    Chronic, well controlled without medications.  She declines treatments or procedures, labs.  Continue to collaborate with cardiology.        Musculoskeletal and Integument   Atopic dermatitis    New onset, has had similar years ago.  Script for Clobetasol cream sent.  Recommend she not run hands under hot water for long periods of time and continue to ensure skin moisturized.  Return for worsening or ongoing.      Osteoarthritis    Chronic, stable.  Refuses PT.  Continue simple treatment at home and adjust as needed.          Other   Hypercholesteremia    Chronic, ongoing.  Refuses  statin and refuses labs today.  Continue to monitor.      Insomnia    Chronic, stable.  Continue current medication regimen and adjust as needed.  Reports good sleep pattern.      Urge incontinence of urine    Chronic, ongoing.  Consider reduction of Oxybutynin in future, change to Myrbetriq, and educated on Kegel exercises and decreasing water intake at night.  Return in 6 months.      Face lesion    Suspect seborrheic keratosis x 2 to face.  She would like referral to dermatology due to changes in size and color.  Will place this.      Relevant Orders   Ambulatory referral to Dermatology       Follow up plan: Return in about 6 months (around 02/10/2020) for HTN/HLD, URGE INCONTINENCE, OA.

## 2019-08-13 NOTE — Assessment & Plan Note (Signed)
Chronic, ongoing.  Consider reduction of Oxybutynin in future, change to Myrbetriq, and educated on Kegel exercises and decreasing water intake at night.  Return in 6 months.

## 2019-08-13 NOTE — Assessment & Plan Note (Signed)
New onset, has had similar years ago.  Script for Clobetasol cream sent.  Recommend she not run hands under hot water for long periods of time and continue to ensure skin moisturized.  Return for worsening or ongoing.

## 2019-08-13 NOTE — Assessment & Plan Note (Signed)
Chronic, stable.  Continue current medication regimen and adjust as needed.  Reports good sleep pattern. 

## 2019-08-13 NOTE — Assessment & Plan Note (Addendum)
Suspect seborrheic keratosis x 2 to face.  She would like referral to dermatology due to changes in size and color.  Will place this.

## 2019-08-13 NOTE — Assessment & Plan Note (Signed)
Chronic, well controlled without medications.  She declines treatments or procedures, labs.  Continue to collaborate with cardiology.

## 2019-09-05 ENCOUNTER — Other Ambulatory Visit: Payer: Self-pay

## 2019-09-05 DIAGNOSIS — Z91040 Latex allergy status: Secondary | ICD-10-CM | POA: Diagnosis not present

## 2019-09-05 DIAGNOSIS — K8020 Calculus of gallbladder without cholecystitis without obstruction: Secondary | ICD-10-CM | POA: Insufficient documentation

## 2019-09-05 DIAGNOSIS — Z79899 Other long term (current) drug therapy: Secondary | ICD-10-CM | POA: Diagnosis not present

## 2019-09-05 DIAGNOSIS — I10 Essential (primary) hypertension: Secondary | ICD-10-CM | POA: Insufficient documentation

## 2019-09-05 DIAGNOSIS — R109 Unspecified abdominal pain: Secondary | ICD-10-CM | POA: Diagnosis not present

## 2019-09-05 DIAGNOSIS — M549 Dorsalgia, unspecified: Secondary | ICD-10-CM | POA: Insufficient documentation

## 2019-09-05 DIAGNOSIS — R000 Tachycardia, unspecified: Secondary | ICD-10-CM | POA: Diagnosis not present

## 2019-09-05 LAB — COMPREHENSIVE METABOLIC PANEL
ALT: 16 U/L (ref 0–44)
AST: 25 U/L (ref 15–41)
Albumin: 4 g/dL (ref 3.5–5.0)
Alkaline Phosphatase: 73 U/L (ref 38–126)
Anion gap: 12 (ref 5–15)
BUN: 18 mg/dL (ref 8–23)
CO2: 23 mmol/L (ref 22–32)
Calcium: 9.3 mg/dL (ref 8.9–10.3)
Chloride: 106 mmol/L (ref 98–111)
Creatinine, Ser: 0.9 mg/dL (ref 0.44–1.00)
GFR calc Af Amer: >60 mL/min (ref >60)
GFR calc non Af Amer: 58 mL/min — ABNORMAL LOW (ref >60)
Glucose, Bld: 144 mg/dL — ABNORMAL HIGH (ref 70–99)
Potassium: 3.9 mmol/L (ref 3.5–5.1)
Sodium: 141 mmol/L (ref 135–145)
Total Bilirubin: 0.5 mg/dL (ref 0.3–1.2)
Total Protein: 8 g/dL (ref 6.5–8.1)

## 2019-09-05 LAB — URINALYSIS, COMPLETE (UACMP) WITH MICROSCOPIC
Bilirubin Urine: NEGATIVE
Glucose, UA: NEGATIVE mg/dL
Hgb urine dipstick: NEGATIVE
Ketones, ur: NEGATIVE mg/dL
Leukocytes,Ua: NEGATIVE
Nitrite: NEGATIVE
Protein, ur: NEGATIVE mg/dL
Specific Gravity, Urine: 1.014 (ref 1.005–1.030)
pH: 6 (ref 5.0–8.0)

## 2019-09-05 LAB — CBC
HCT: 37.6 % (ref 36.0–46.0)
Hemoglobin: 12.5 g/dL (ref 12.0–15.0)
MCH: 30.1 pg (ref 26.0–34.0)
MCHC: 33.2 g/dL (ref 30.0–36.0)
MCV: 90.6 fL (ref 80.0–100.0)
Platelets: 248 10*3/uL (ref 150–400)
RBC: 4.15 MIL/uL (ref 3.87–5.11)
RDW: 13.7 % (ref 11.5–15.5)
WBC: 11.1 10*3/uL — ABNORMAL HIGH (ref 4.0–10.5)
nRBC: 0 % (ref 0.0–0.2)

## 2019-09-05 NOTE — ED Triage Notes (Signed)
Pt states left flank pain that began this afternoon. Pt denies fever or chills. Pt with history of renal calculi in past. Pt denies nausea or vomiting.

## 2019-09-05 NOTE — ED Notes (Signed)
SPOKE WITH DR. SIADECKI REGARDING PT'S SYMPTOMS. NO ORDER FOR TROPONIN RECEIVED.

## 2019-09-06 ENCOUNTER — Emergency Department
Admission: EM | Admit: 2019-09-06 | Discharge: 2019-09-06 | Disposition: A | Discharge status: Home / Self Care | Payer: Medicare HMO | Attending: Emergency Medicine | Admitting: Emergency Medicine

## 2019-09-06 ENCOUNTER — Emergency Department: Payer: Medicare HMO

## 2019-09-06 DIAGNOSIS — K8020 Calculus of gallbladder without cholecystitis without obstruction: Secondary | ICD-10-CM

## 2019-09-06 DIAGNOSIS — R109 Unspecified abdominal pain: Secondary | ICD-10-CM

## 2019-09-06 DIAGNOSIS — M549 Dorsalgia, unspecified: Secondary | ICD-10-CM

## 2019-09-06 LAB — LIPASE, BLOOD: Lipase: 27 U/L (ref 11–51)

## 2019-09-06 MED ORDER — MORPHINE SULFATE (PF) 4 MG/ML IV SOLN
4.0000 mg | Freq: Once | INTRAVENOUS | Status: AC
  Administered 2019-09-06: 4 mg via INTRAVENOUS
  Filled 2019-09-06: qty 1

## 2019-09-06 MED ORDER — HYDROCODONE-ACETAMINOPHEN 5-325 MG PO TABS
1.0000 | ORAL_TABLET | Freq: Once | ORAL | Status: AC
  Administered 2019-09-06: 1 via ORAL
  Filled 2019-09-06: qty 1

## 2019-09-06 MED ORDER — ONDANSETRON HCL 4 MG/2ML IJ SOLN
4.0000 mg | Freq: Once | INTRAMUSCULAR | Status: AC
  Administered 2019-09-06: 4 mg via INTRAVENOUS
  Filled 2019-09-06: qty 2

## 2019-09-06 MED ORDER — HYDROCODONE-ACETAMINOPHEN 5-325 MG PO TABS: 1.0000 | ORAL_TABLET | Freq: Four times a day (QID) | ORAL | 0 refills | Status: DC | PRN

## 2019-09-06 NOTE — ED Notes (Signed)
Called lab to add on lipase.  

## 2019-09-06 NOTE — ED Notes (Signed)
Transported to CT 

## 2019-09-06 NOTE — ED Provider Notes (Signed)
[facility] Emergency Department Provider Note   ____________________________________________   First MD Initiated Contact with Patient 09/06/19 [phone]     (approximate)  I have reviewed the triage vital signs and the nursing notes.   HISTORY  Chief Complaint Flank Pain    HPI Holly Lewis is a 86 y.o. female who presents to the ED from home with a chief complaint of left flank pain.  Patient reports onset of left flank pain yesterday afternoon.  Denies fall/trauma/injury.  Does note that she is very active, works in the yard and frequently lifting items.  Patient does have a history of kidney stones.  Denies fever, cough, chest pain, shortness of breath, abdominal pain, nausea, vomiting, hematuria or dysuria.  Denies recent travel or hormone use.       Past Medical History:  Diagnosis Date  . Anxiety   . Blood transfusion without reported diagnosis   . Cataract   . Chest pain    a. Pt reports nl stress test in 2016 in [place]; b. 05/2017 declined stress test for recurrent c/p.  Marland Kitchen Hyperlipidemia   . Hypertension   . PAF (paroxysmal atrial fibrillation) (HCC)    a. Dx in 2017 in [place] per pt-->s/p TEE/DCCV;  b. CHA2DS2VASc = 4-->Eliquis.    Patient Active Problem List   Diagnosis Date Noted  . Face lesion 08/13/2019  . PAF (paroxysmal atrial fibrillation) (HCC) 02/05/2019  . Osteoarthritis 05/28/2018  . Advanced care planning/counseling discussion 11/14/2017  . DNR (do not resuscitate) 11/14/2017  . Atopic dermatitis 01/09/2017  . Urge incontinence of urine 01/09/2017  . Essential hypertension, benign 12/12/2016  . Hypercholesteremia 12/12/2016  . Insomnia 12/12/2016    Past Surgical History:  Procedure Laterality Date  . CHOLECYSTECTOMY    . EYE SURGERY    . 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 kidney stones      Prior to Admission medications   Medication Sig Start Date End Date Taking?  Authorizing Provider  Apoaequorin (PREVAGEN PO) Take by mouth daily.    [provider]  clobetasol cream (TEMOVATE) 0.05 % Apply 1 application topically 2 (two) times daily. 08/13/19   Cannady, Corrie Dandy T, NP  doxepin (SINEQUAN) 10 MG capsule Take 1 capsule (10 mg total) by mouth at bedtime. 02/05/19   Cannady, Corrie Dandy T, NP  HYDROcodone-acetaminophen (NORCO) 5-325 MG tablet Take 1 tablet by mouth every 6 (six) hours as needed for moderate pain. 09/06/19   Irean Hong, MD  Melatonin 10 MG TABS Take 10 mg by mouth at bedtime.    [provider]  meloxicam (MOBIC) 15 MG tablet Take 0.5 tablets (7.5 mg total) by mouth daily as needed. 03/03/19   Aura Dials T, NP  Multiple Vitamin (MULTIVITAMIN) tablet Take 1 tablet by mouth daily.    [provider]  oxybutynin (DITROPAN XL) 15 MG 24 hr tablet Take 1 tablet (15 mg total) by mouth at bedtime. 02/05/19   Aura Dials T, NP    Allergies Diltiazem and Latex  Family History  Problem Relation Age of Onset  . Diabetes Mother   . Diabetes Sister   . Cancer Sister        breast  . Alzheimer's disease Sister   . Diabetes Brother   . Cancer Brother        blood    Social History Social History   Tobacco Use  . Smoking status: Never  Smoker  . Smokeless tobacco: Never Used  Substance Use Topics  . Alcohol use: No  . Drug use: No    Review of Systems  Constitutional: No fever/chills Eyes: No visual changes. ENT: No sore throat. Cardiovascular: Denies chest pain. Respiratory: Denies shortness of breath. Gastrointestinal: No abdominal pain.  No nausea, no vomiting.  No diarrhea.  No constipation. Genitourinary: Negative for dysuria. Musculoskeletal: Positive for back pain. Skin: Negative for rash. Neurological: Negative for headaches, focal weakness or numbness.   ____________________________________________   PHYSICAL EXAM:  VITAL SIGNS: ED Triage Vitals  Enc Vitals Group     BP 09/05/19 2006 (!)  178/95     Pulse Rate 09/05/19 2006 (!) 105     Resp 09/05/19 2006 20     Temp 09/05/19 2006 98.5 F (36.9 C)     Temp Source 09/05/19 2006 Oral     SpO2 09/05/19 2006 100 %     Weight 09/05/19 2007 190 lb (86.2 kg)     Height 09/05/19 2007 5\' 1"  (1.549 m)     Head Circumference --      Peak Flow --      Pain Score 09/05/19 2006 5     Pain Loc --      Pain Edu? --      Excl. in [place]? --     Constitutional: Alert and oriented. Well appearing and in no acute distress. Eyes: Conjunctivae are normal. PERRL. EOMI. Head: Atraumatic. Nose: No congestion/rhinnorhea. Mouth/Throat: Mucous membranes are moist.  Oropharynx non-erythematous. Neck: No stridor.   Cardiovascular: Normal rate, regular rhythm. Grossly normal heart sounds.  Good peripheral circulation. Respiratory: Normal respiratory effort.  No retractions. Lungs CTAB. Gastrointestinal: Soft and nontender to light or deep palpation. No distention. No abdominal bruits. No CVA tenderness. Musculoskeletal: No lower extremity tenderness nor edema.  No joint effusions. Neurologic:  Normal speech and language. No gross focal neurologic deficits are appreciated. No gait instability. Skin:  Skin is warm, dry and intact. No rash noted.  No vesicles. Psychiatric: Mood and affect are normal. Speech and behavior are normal.  ____________________________________________   LABS (all labs ordered are listed, but only abnormal results are displayed)  Labs Reviewed  URINALYSIS, COMPLETE (UACMP) WITH MICROSCOPIC - Abnormal; Notable for the following components:      Result Value   Color, Urine YELLOW (*)    APPearance CLEAR (*)    Bacteria, UA RARE (*)    All other components within normal limits  CBC - Abnormal; Notable for the following components:   WBC 11.1 (*)    All other components within normal limits  COMPREHENSIVE METABOLIC PANEL - Abnormal; Notable for the following components:   Glucose, Bld 144 (*)    GFR calc non Af Amer 58  (*)    All other components within normal limits  LIPASE, BLOOD   ____________________________________________  EKG  ED ECG REPORT I, Tadeusz Stahl J, the attending physician, personally viewed and interpreted this ECG.   Date: 09/06/2019  EKG Time: 2008  Rate: 110  Rhythm: sinus tachycardia  Axis: Normal  Intervals:none  ST&T Change: Nonspecific  ____________________________________________  RADIOLOGY  ED MD interpretation: Cholelithiasis without cholecystitis; no ureteral stones or hydronephrosis  Official radiology report(s): CT Renal Stone Study  Result Date: 09/06/2019 CLINICAL DATA:  Left flank pain EXAM: CT ABDOMEN AND PELVIS WITHOUT CONTRAST TECHNIQUE: Multidetector CT imaging of the abdomen and pelvis was performed following the standard protocol without IV contrast. COMPARISON:  None. FINDINGS: Lower chest: No  acute abnormality Hepatobiliary: Gallstones within the gallbladder. There appears to be gallbladder wall calcifications. No biliary ductal dilatation. No focal hepatic abnormality. Pancreas: No focal abnormality or ductal dilatation. Spleen: No focal abnormality.  Normal size. Adrenals/Urinary Tract: No adrenal abnormality. No focal renal abnormality. No stones or hydronephrosis. Urinary bladder is unremarkable. Stomach/Bowel: Normal appendix. Stomach, large and small bowel grossly unremarkable. Vascular/Lymphatic: Aortic atherosclerosis. No enlarged abdominal or pelvic lymph nodes. Reproductive: Uterus and adnexa unremarkable.  No mass. Other: 2 No free fluid or free air. Musculoskeletal: No acute bony abnormality. IMPRESSION: Cholelithiasis.  Gallbladder wall calcifications. No renal or ureteral stones.  No hydronephrosis. Aortic atherosclerosis. No acute findings. Electronically Signed   By: Charlett Nose M.D.   On: 09/06/2019 02:46    ____________________________________________   PROCEDURES  Procedure(s) performed (including Critical Care):  .1-3 Lead EKG  Interpretation Performed by: Irean Hong, MD Authorized by: Irean Hong, MD     Interpretation: normal     ECG rate:  98   ECG rate assessment: normal     Rhythm: sinus rhythm     Ectopy: none     Conduction: normal   Comments:     Patient placed on cardiac monitor since she received IV opiates     ____________________________________________   INITIAL IMPRESSION / ASSESSMENT AND PLAN / ED COURSE  As part of my medical decision making, I reviewed the following data within the electronic MEDICAL RECORD NUMBER History obtained from family, Nursing notes reviewed and incorporated, Labs reviewed, EKG interpreted, Old chart reviewed, Radiograph reviewed, Notes from prior ED visits and [place] Controlled Substance Database     Holly Lewis was evaluated in Emergency Department on 09/06/2019 for the symptoms described in the history of present illness. She was evaluated in the context of the global COVID-19 pandemic, which necessitated consideration that the patient might be at risk for infection with the SARS-CoV-2 virus that causes COVID-19. Institutional protocols and algorithms that pertain to the evaluation of patients at risk for COVID-19 are in a state of rapid change based on information released by regulatory bodies including the CDC and federal and state organizations. These policies and algorithms were followed during the patient's care in the ED.    86 year old female who presents with left flank pain, history of kidney stones. Differential diagnosis includes, but is not limited to, ovarian cyst, ovarian torsion, acute appendicitis, diverticulitis, urinary tract infection/pyelonephritis, endometriosis, bowel obstruction, colitis, renal colic, gastroenteritis, hernia, fibroids, etc.  Laboratory and UA results unremarkable.  CT does not demonstrate ureteral stones or hydronephrosis.  Incidental cholelithiasis noted.  Son notes that prior to morphine administration, patient could not move  around comfortably.  I examined the patient after IV morphine administration and she is able to sit up without wincing in pain.  We discussed likely etiology is musculoskeletal.  However, I did advise them to monitor the area over the next 2 to 3 days to look for vesicular rash indicative of shingles.  Will discharge home with small quantity Norco to use as needed for pain.  Strict return precautions given.  Patient and son verbalized understanding and agree with plan of care.      ____________________________________________   FINAL CLINICAL IMPRESSION(S) / ED DIAGNOSES  Final diagnoses:  Left flank pain  Musculoskeletal back pain  Calculus of gallbladder without cholecystitis without obstruction     ED Discharge Orders         Ordered    HYDROcodone-acetaminophen (NORCO) 5-325 MG tablet  Every 6  hours PRN     09/06/19 0330           Note:  This document was prepared using Dragon voice recognition software and may include unintentional dictation errors.   Irean Hong, MD 09/06/19 [phone]

## 2019-09-06 NOTE — Discharge Instructions (Addendum)
1.  You may take Norco as needed for pain. 2.  Apply moist heat to affected area several times daily. 3.  Please keep an eye on the affected area and see your doctor or return to the ER if you develop a blisterlike rash. 4.  Return to the ER for worsening symptoms, persistent vomiting, difficulty breathing or other concerns.

## 2019-09-09 ENCOUNTER — Telehealth: Payer: Self-pay | Admitting: Nurse Practitioner

## 2019-09-09 NOTE — Telephone Encounter (Signed)
-----   Message from Marjie Skiff, NP sent at 09/08/2019  4:47 AM EDT ----- Needs Er follow-up please.

## 2019-09-09 NOTE — Telephone Encounter (Signed)
PEC scheduled this apt on providers virtual day (every Thursday is Cannady's virtual day) ,called to reschedule and make pt aware was unable to LVM due to full mail box.

## 2019-09-11 ENCOUNTER — Inpatient Hospital Stay: Payer: Medicare HMO | Admitting: Nurse Practitioner

## 2019-09-15 NOTE — Telephone Encounter (Signed)
Pt has apt on 09/16/2019

## 2019-09-16 ENCOUNTER — Encounter: Payer: Self-pay | Admitting: Nurse Practitioner

## 2019-09-16 ENCOUNTER — Ambulatory Visit (INDEPENDENT_AMBULATORY_CARE_PROVIDER_SITE_OTHER): Payer: Medicare HMO | Admitting: Nurse Practitioner

## 2019-09-16 DIAGNOSIS — R109 Unspecified abdominal pain: Secondary | ICD-10-CM | POA: Diagnosis not present

## 2019-09-16 NOTE — Progress Notes (Signed)
There were no vitals taken for this visit.   Subjective:    Patient ID: Holly Lewis, female    DOB: [birth year], 86 y.o.   MRN: [id number]  HPI: Holly Lewis is a 86 y.o. female  Chief Complaint  Patient presents with  . Hospitalization Follow-up    . This visit was completed via telephone due to the restrictions of the COVID-19 pandemic. All issues as above were discussed and addressed but no physical exam was performed. If it was felt that the patient should be evaluated in the office, they were directed there. The patient verbally consented to this visit. Patient was unable to complete an audio/visual visit due to Lack of equipment. Due to the catastrophic nature of the COVID-19 pandemic, this visit was done through audio contact only. . Location of the patient: home . Location of the provider: work . Those involved with this call:  . Provider: Aura Dials, DNP . CMA: Wilhemena Durie, CMA . Front Desk/Registration: Adela Ports  . Time spent on call: 25 minutes on the phone discussing health concerns. 15 minutes total spent in review of patient's record and preparation of their chart.  . I verified patient identity using two factors (patient name and date of birth). Patient consents verbally to being seen via telemedicine visit today.    ER FOLLOW UP Was seen in ER on 09/06/2019 for left flank pain.  Full work-up was performed in ER including labs and imaging.  CT abdomen and pelvis noted cholelithiasis and gallbladder wall calcifications, no renal or ureteral stones, no hydronephrosis.  Aortic atherosclerosis was noted on imaging, she continues to refuse statin therapy.  Labs and UA were unremarkable.  She was provided Morphine in ER with benefit.  Suspicion was musculoskeletal pain, however she was instructed to monitor for rashes.  She denies any rashes at this time.  Sees Dr. Lady Gary on April 1st to discuss gallbladder findings on imaging.    Had kidney stones  years ago, but has not had these in some time.  She reports at this time her pain is better.  Denies N&V, abdominal pain, decreased appetite, rash, fever, SOB, CP, or back pain. Time since discharge:  Hospital/facility: [facility] Diagnosis: left flank pain Procedures/tests: Multiple labs and CT abdomen and pelvis Consultants: none New medications: none Discharge instructions:  Follow-up with PCP and general surgery (Dr. Lady Gary) Status: better   Relevant past medical, surgical, family and social history reviewed and updated as indicated. Interim medical history since our last visit reviewed. Allergies and medications reviewed and updated.  Review of Systems  Constitutional: Negative for activity change, appetite change, diaphoresis, fatigue and fever.  Respiratory: Negative for cough, chest tightness and shortness of breath.   Cardiovascular: Negative for chest pain, palpitations and leg swelling.  Gastrointestinal: Negative.   Musculoskeletal: Negative for arthralgias.  Skin: Negative for rash.  Neurological: Negative.   Psychiatric/Behavioral: Negative.     Per HPI unless specifically indicated above     Objective:    There were no vitals taken for this visit.  Wt Readings from Last 3 Encounters:  09/05/19 190 lb (86.2 kg)  08/11/19 191 lb (86.6 kg)  07/04/19 188 lb (85.3 kg)    Physical Exam   Unable to perform due to telephone visit only  Results for orders placed or performed during the hospital encounter of 09/06/19  Urinalysis, Complete w Microscopic  Result Value Ref Range   Color, Urine YELLOW (A) YELLOW   APPearance CLEAR (A) CLEAR  Specific Gravity, Urine 1.014 1.005 - 1.030   pH 6.0 5.0 - 8.0   Glucose, UA NEGATIVE NEGATIVE mg/dL   Hgb urine dipstick NEGATIVE NEGATIVE   Bilirubin Urine NEGATIVE NEGATIVE   Ketones, ur NEGATIVE NEGATIVE mg/dL   Protein, ur NEGATIVE NEGATIVE mg/dL   Nitrite NEGATIVE NEGATIVE   Leukocytes,Ua NEGATIVE NEGATIVE   WBC, UA 0-5 0 -  5 WBC/hpf   Bacteria, UA RARE (A) NONE SEEN   Squamous Epithelial / LPF 0-5 0 - 5   Mucus PRESENT   CBC  Result Value Ref Range   WBC 11.1 (H) 4.0 - 10.5 K/uL   RBC 4.15 3.87 - 5.11 MIL/uL   Hemoglobin 12.5 12.0 - 15.0 g/dL   HCT 37.6 36.0 - 46.0 %   MCV 90.6 80.0 - 100.0 fL   MCH 30.1 26.0 - 34.0 pg   MCHC 33.2 30.0 - 36.0 g/dL   RDW 13.7 11.5 - 15.5 %   Platelets 248 150 - 400 K/uL   nRBC 0.0 0.0 - 0.2 %  Comprehensive metabolic panel  Result Value Ref Range   Sodium 141 135 - 145 mmol/L   Potassium 3.9 3.5 - 5.1 mmol/L   Chloride 106 98 - 111 mmol/L   CO2 23 22 - 32 mmol/L   Glucose, Bld 144 (H) 70 - 99 mg/dL   BUN 18 8 - 23 mg/dL   Creatinine, Ser 0.90 0.44 - 1.00 mg/dL   Calcium 9.3 8.9 - 10.3 mg/dL   Total Protein 8.0 6.5 - 8.1 g/dL   Albumin 4.0 3.5 - 5.0 g/dL   AST 25 15 - 41 U/L   ALT 16 0 - 44 U/L   Alkaline Phosphatase 73 38 - 126 U/L   Total Bilirubin 0.5 0.3 - 1.2 mg/dL   GFR calc non Af Amer 58 (L) >60 mL/min   GFR calc Af Amer >60 >60 mL/min   Anion gap 12 5 - 15  Lipase, blood  Result Value Ref Range   Lipase 27 11 - 51 U/L      Assessment & Plan:   Problem List Items Addressed This Visit      Other   Left flank pain - Primary    Acute with recent ER visit, symptoms improved at this time.  Recommend she keep scheduled visit with general surgery on 09/18/2019 to further review imaging and determine plan of care.  Continue current regimen at home for OA pain, including Tylenol as needed and Meloxicam.  Return as scheduled, sooner if worsening or return of symptoms.         I discussed the assessment and treatment plan with the patient. The patient was provided an opportunity to ask questions and all were answered. The patient agreed with the plan and demonstrated an understanding of the instructions.   The patient was advised to call back or seek an in-person evaluation if the symptoms worsen or if the condition fails to improve as anticipated.   I  provided 25+ minutes of time during this encounter.  Follow up plan: Return if symptoms worsen or fail to improve.

## 2019-09-16 NOTE — Patient Instructions (Signed)
Acute Back Pain, Adult Acute back pain is sudden and usually short-lived. It is often caused by an injury to the muscles and tissues in the back. The injury may result from:  A muscle or ligament getting overstretched or torn (strained). Ligaments are tissues that connect bones to each other. Lifting something improperly can cause a back strain.  Wear and tear (degeneration) of the spinal disks. Spinal disks are circular tissue that provides cushioning between the bones of the spine (vertebrae).  Twisting motions, such as while playing sports or doing yard work.  A hit to the back.  Arthritis. You may have a physical exam, lab tests, and imaging tests to find the cause of your pain. Acute back pain usually goes away with rest and home care. Follow these instructions at home: Managing pain, stiffness, and swelling  Take over-the-counter and prescription medicines only as told by your health care provider.  Your health care provider may recommend applying ice during the first 24-48 hours after your pain starts. To do this: ? Put ice in a plastic bag. ? Place a towel between your skin and the bag. ? Leave the ice on for 20 minutes, 2-3 times a day.  If directed, apply heat to the affected area as often as told by your health care provider. Use the heat source that your health care provider recommends, such as a moist heat pack or a heating pad. ? Place a towel between your skin and the heat source. ? Leave the heat on for 20-30 minutes. ? Remove the heat if your skin turns bright red. This is especially important if you are unable to feel pain, heat, or cold. You have a greater risk of getting burned. Activity   Do not stay in bed. Staying in bed for more than 1-2 days can delay your recovery.  Sit up and stand up straight. Avoid leaning forward when you sit, or hunching over when you stand. ? If you work at a desk, sit close to it so you do not need to lean over. Keep your chin tucked  in. Keep your neck drawn back, and keep your elbows bent at a right angle. Your arms should look like the letter "L." ? Sit high and close to the steering wheel when you drive. Add lower back (lumbar) support to your car seat, if needed.  Take short walks on even surfaces as soon as you are able. Try to increase the length of time you walk each day.  Do not sit, drive, or stand in one place for more than 30 minutes at a time. Sitting or standing for long periods of time can put stress on your back.  Do not drive or use heavy machinery while taking prescription pain medicine.  Use proper lifting techniques. When you bend and lift, use positions that put less stress on your back: ? Bend your knees. ? Keep the load close to your body. ? Avoid twisting.  Exercise regularly as told by your health care provider. Exercising helps your back heal faster and helps prevent back injuries by keeping muscles strong and flexible.  Work with a physical therapist to make a safe exercise program, as recommended by your health care provider. Do any exercises as told by your physical therapist. Lifestyle  Maintain a healthy weight. Extra weight puts stress on your back and makes it difficult to have good posture.  Avoid activities or situations that make you feel anxious or stressed. Stress and anxiety increase muscle   tension and can make back pain worse. Learn ways to manage anxiety and stress, such as through exercise. General instructions  Sleep on a firm mattress in a comfortable position. Try lying on your side with your knees slightly bent. If you lie on your back, put a pillow under your knees.  Follow your treatment plan as told by your health care provider. This may include: ? Cognitive or behavioral therapy. ? Acupuncture or massage therapy. ? Meditation or yoga. Contact a health care provider if:  You have pain that is not relieved with rest or medicine.  You have increasing pain going down  into your legs or buttocks.  Your pain does not improve after 2 weeks.  You have pain at night.  You lose weight without trying.  You have a fever or chills. Get help right away if:  You develop new bowel or bladder control problems.  You have unusual weakness or numbness in your arms or legs.  You develop nausea or vomiting.  You develop abdominal pain.  You feel faint. Summary  Acute back pain is sudden and usually short-lived.  Use proper lifting techniques. When you bend and lift, use positions that put less stress on your back.  Take over-the-counter and prescription medicines and apply heat or ice as directed by your health care provider. This information is not intended to replace advice given to you by your health care provider. Make sure you discuss any questions you have with your health care provider. Document Revised: 09/24/2018 Document Reviewed: 01/17/2017 Elsevier Patient Education  2020 [employer]  

## 2019-09-16 NOTE — Assessment & Plan Note (Signed)
Acute with recent ER visit, symptoms improved at this time.  Recommend she keep scheduled visit with general surgery on 09/18/2019 to further review imaging and determine plan of care.  Continue current regimen at home for OA pain, including Tylenol as needed and Meloxicam.  Return as scheduled, sooner if worsening or return of symptoms.

## 2019-09-18 ENCOUNTER — Other Ambulatory Visit: Payer: Self-pay

## 2019-09-18 ENCOUNTER — Encounter: Payer: Self-pay | Admitting: General Surgery

## 2019-09-18 ENCOUNTER — Ambulatory Visit (INDEPENDENT_AMBULATORY_CARE_PROVIDER_SITE_OTHER): Payer: Medicare HMO | Admitting: General Surgery

## 2019-09-18 VITALS — BP 175/93 | HR 98 | Temp 97.3°F | Resp 14 | Ht 61.0 in | Wt 188.8 lb

## 2019-09-18 DIAGNOSIS — K8020 Calculus of gallbladder without cholecystitis without obstruction: Secondary | ICD-10-CM | POA: Diagnosis not present

## 2019-09-18 NOTE — Patient Instructions (Addendum)
If in the future you start having pain please contact our office.   Cholelithiasis  Cholelithiasis is also called "gallstones." It is a kind of gallbladder disease. The gallbladder is an organ that stores a liquid (bile) that helps you digest fat. Gallstones may not cause symptoms (may be silent gallstones) until they cause a blockage, and then they can cause pain (gallbladder attack). Follow these instructions at home:  Take over-the-counter and prescription medicines only as told by your doctor.  Stay at a healthy weight.  Eat healthy foods. This includes: ? Eating fewer fatty foods, like fried foods. ? Eating fewer refined carbs (refined carbohydrates). Refined carbs are breads and grains that are highly processed, like white bread and white rice. Instead, choose whole grains like whole-wheat bread and brown rice. ? Eating more fiber. Almonds, fresh fruit, and beans are healthy sources of fiber.  Keep all follow-up visits as told by your doctor. This is important. Contact a doctor if:  You have sudden pain in the upper right side of your belly (abdomen). Pain might spread to your right shoulder or your chest. This may be a sign of a gallbladder attack.  You feel sick to your stomach (are nauseous).  You throw up (vomit).  You have been diagnosed with gallstones that have no symptoms and you get: ? Belly pain. ? Discomfort, burning, or fullness in the upper part of your belly (indigestion). Get help right away if:  You have sudden pain in the upper right side of your belly, and it lasts for more than 2 hours.  You have belly pain that lasts for more than 5 hours.  You have a fever or chills.  You keep feeling sick to your stomach or you keep throwing up.  Your skin or the whites of your eyes turn yellow (jaundice).  You have dark-colored pee (urine).  You have light-colored poop (stool). Summary  Cholelithiasis is also called "gallstones."  The gallbladder is an organ  that stores a liquid (bile) that helps you digest fat.  Silent gallstones are gallstones that do not cause symptoms.  A gallbladder attack may cause sudden pain in the upper right side of your belly. Pain might spread to your right shoulder or your chest. If this happens, contact your doctor.  If you have sudden pain in the upper right side of your belly that lasts for more than 2 hours, get help right away. This information is not intended to replace advice given to you by your health care provider. Make sure you discuss any questions you have with your health care provider. Document Revised: 05/18/2017 Document Reviewed: 02/20/2016 Elsevier Patient Education  2020 [employer].

## 2019-09-18 NOTE — Progress Notes (Signed)
Patient ID: Holly Lewis, female   DOB: 09-03-1932, 86 y.o.   MRN: [id number]  Chief Complaint  Patient presents with  . New Patient (Initial Visit)    Gallstones    HPI Holly Lewis is a 86 y.o. female.   She is here today as a referral from the emergency department.  On 06 September 2019, she presented to the emergency department with left-sided flank pain.  She has a history of nephrolithiasis.  She underwent a stone protocol CT scan that did not demonstrate any nephrolithiasis or ureteral stones, but did demonstrate stones in the gallbladder.  She did not have any right upper quadrant or epigastric pain at the time.  She denies any nausea or vomiting.  No fevers or chills.  Her appetite is good and she is having normal bowel movements.  She has never had jaundice, acholic stools, or dark tea-colored urine.  She has never had pancreatitis.  She states that she has actually *never* experienced any significant right upper quadrant or epigastric pain.  Labs obtained in the emergency department did not show any hyperbilirubinemia or transaminitis.  Her white blood cell count was mildly elevated at that time.  Evaluation in the emergency department was most consistent with musculoskeletal pain, as the patient had been very active working in her garden and lifting heavy things prior to her presentation.   Past Medical History:  Diagnosis Date  . Anxiety   . Blood transfusion without reported diagnosis   . Cataract   . Chest pain    a. Pt reports nl stress test in 2016 in [place]; b. 05/2017 declined stress test for recurrent c/p.  Marland Kitchen Hyperlipidemia   . Hypertension   . PAF (paroxysmal atrial fibrillation) ([place])    a. Dx in 2017 in [place] per pt-->s/p TEE/DCCV;  b. CHA2DS2VASc = 4-->Eliquis.    Past Surgical History:  Procedure Laterality Date  . EYE SURGERY    . 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 kidney stones      Family History  Problem Relation Age  of Onset  . Diabetes Mother   . Diabetes Sister   . Cancer Sister        breast  . Alzheimer's disease Sister   . Diabetes Brother   . Cancer Brother        blood    Social History Social History   Tobacco Use  . Smoking status: Never Smoker  . Smokeless tobacco: Never Used  Substance Use Topics  . Alcohol use: No  . Drug use: No    Allergies  Allergen Reactions  . Diltiazem Rash  . Latex Rash    Current Outpatient Medications  Medication Sig Dispense Refill  . Apoaequorin (PREVAGEN PO) Take by mouth daily.    . clobetasol cream (TEMOVATE) 9.73 % Apply 1 application topically 2 (two) times daily. 30 g 0  . doxepin (SINEQUAN) 10 MG capsule Take 1 capsule (10 mg total) by mouth at bedtime. 90 capsule 3  . HYDROcodone-acetaminophen (NORCO) 5-325 MG tablet Take 1 tablet by mouth every 6 (six) hours as needed for moderate pain. 15 tablet 0  . Melatonin 10 MG TABS Take 10 mg by mouth at bedtime.    . meloxicam (MOBIC) 15 MG tablet Take 0.5 tablets (7.5 mg total) by mouth daily as needed. 90 tablet 1  . Multiple Vitamin (MULTIVITAMIN) tablet Take 1 tablet by mouth daily.    Marland Kitchen  oxybutynin (DITROPAN XL) 15 MG 24 hr tablet Take 1 tablet (15 mg total) by mouth at bedtime. 90 tablet 3   No current facility-administered medications for this visit.    Review of Systems Review of Systems  Musculoskeletal: Positive for arthralgias.  All other systems reviewed and are negative.   Blood pressure (!) 175/93, pulse 98, temperature (!) 97.3 F (36.3 C), resp. rate 14, height 5\' 1"  (1.549 m), weight 188 lb 12.8 oz (85.6 kg), SpO2 96 %. Body mass index is 35.67 kg/m.  Physical Exam Physical Exam Constitutional:      General: She is not in acute distress.    Appearance: Normal appearance. She is obese.  HENT:     Head: Normocephalic and atraumatic.     Nose:     Comments: Covered with a mask secondary to COVID-19 precautions    Mouth/Throat:     Comments: Covered with a mask  secondary to COVID-19 precautions Eyes:     General: No scleral icterus.       Right eye: No discharge.        Left eye: No discharge.     Conjunctiva/sclera: Conjunctivae normal.  Neck:     Comments: No thyromegaly or dominant thyroid masses appreciated.  The gland moves freely with deglutition. Cardiovascular:     Rate and Rhythm: Normal rate and regular rhythm.  Pulmonary:     Effort: Pulmonary effort is normal.     Comments: Low pitched end expiratory wheezes, best appreciated at the bilateral lower lobes. Abdominal:     General: Bowel sounds are normal. There is no distension.     Palpations: Abdomen is soft.     Tenderness: There is no abdominal tenderness. There is no guarding.     Comments: Protuberant, consistent with her level of obesity.  Genitourinary:    Comments: Deferred Musculoskeletal:     Cervical back: No rigidity.     Left lower leg: Edema present.  Lymphadenopathy:     Cervical: No cervical adenopathy.  Skin:    General: Skin is warm and dry.  Neurological:     General: No focal deficit present.     Mental Status: She is alert and oriented to person, place, and time.  Psychiatric:        Mood and Affect: Mood normal.        Behavior: Behavior normal.     Data Reviewed Results for CHARLISSA, PETROS (MRN [name]) as of 09/18/2019 13:48  Ref. Range 09/05/2019 20:18  Potassium Latest Ref Range: 3.5 - 5.1 mmol/L 3.9  Chloride Latest Ref Range: 98 - 111 mmol/L 106  CO2 Latest Ref Range: 22 - 32 mmol/L 23  Glucose Latest Ref Range: 70 - 99 mg/dL 09/07/2019 (H)  BUN Latest Ref Range: 8 - 23 mg/dL 18  Creatinine Latest Ref Range: 0.44 - 1.00 mg/dL 426  Calcium Latest Ref Range: 8.9 - 10.3 mg/dL 9.3  Anion gap Latest Ref Range: 5 - 15  12  Alkaline Phosphatase Latest Ref Range: 38 - 126 U/L 73  Albumin Latest Ref Range: 3.5 - 5.0 g/dL 4.0  Lipase Latest Ref Range: 11 - 51 U/L 27  AST Latest Ref Range: 15 - 41 U/L 25  ALT Latest Ref Range: 0 - 44 U/L 16  Total  Protein Latest Ref Range: 6.5 - 8.1 g/dL 8.0  Total Bilirubin Latest Ref Range: 0.3 - 1.2 mg/dL 0.5  GFR, Est Non African American Latest Ref Range: >60 mL/min 58 (L)  GFR, Est  African American Latest Ref Range: >60 mL/min >60  WBC Latest Ref Range: 4.0 - 10.5 K/uL 11.1 (H)  RBC Latest Ref Range: 3.87 - 5.11 MIL/uL 4.15  Hemoglobin Latest Ref Range: 12.0 - 15.0 g/dL 76.2  HCT Latest Ref Range: 36.0 - 46.0 % 37.6  MCV Latest Ref Range: 80.0 - 100.0 fL 90.6  MCH Latest Ref Range: 26.0 - 34.0 pg 30.1  MCHC Latest Ref Range: 30.0 - 36.0 g/dL 83.1  RDW Latest Ref Range: 11.5 - 15.5 % 13.7  Platelets Latest Ref Range: 150 - 400 K/uL 248  nRBC Latest Ref Range: 0.0 - 0.2 % 0.0  As discussed in the history of present illness, there is no clear evidence of biliary pathology based upon these labs.  I also reviewed the emergency department evaluation, which is summarized in the history of present illness.  The imaging from her visit there was also personally reviewed.  I agree with the findings described by the radiologist, copied here:  CLINICAL DATA:  Left flank pain  EXAM: CT ABDOMEN AND PELVIS WITHOUT CONTRAST  TECHNIQUE: Multidetector CT imaging of the abdomen and pelvis was performed following the standard protocol without IV contrast.  COMPARISON:  None.  FINDINGS: Lower chest: No acute abnormality  Hepatobiliary: Gallstones within the gallbladder. There appears to be gallbladder wall calcifications. No biliary ductal dilatation. No focal hepatic abnormality.  Pancreas: No focal abnormality or ductal dilatation.  Spleen: No focal abnormality.  Normal size.  Adrenals/Urinary Tract: No adrenal abnormality. No focal renal abnormality. No stones or hydronephrosis. Urinary bladder is unremarkable.  Stomach/Bowel: Normal appendix. Stomach, large and small bowel grossly unremarkable.  Vascular/Lymphatic: Aortic atherosclerosis. No enlarged abdominal or pelvic lymph  nodes.  Reproductive: Uterus and adnexa unremarkable.  No mass.  Other: 2 No free fluid or free air.  Musculoskeletal: No acute bony abnormality.  IMPRESSION: Cholelithiasis.  Gallbladder wall calcifications.  No renal or ureteral stones.  No hydronephrosis.  Aortic atherosclerosis.  No acute findings.  Assessment This is an 86 year old woman who presented to the emergency department with left flank pain.  No etiology was identified on imaging or labs, but the clinical impression was most consistent with musculoskeletal pain.  On imaging performed to evaluate for nephrolithiasis, which she was found to have calcifications of the gallbladder wall, as well as choleithiasis.  She is completely asymptomatic from a biliary perspective.  Plan I discussed with Holly Lewis that many people have gallstones and never experience any symptoms nor do they require surgery.  We reviewed the symptoms of biliary colic and cholecystitis together today.  I do not think she requires operative intervention.  She seemed quite relieved to hear this.  Should she become symptomatic, we of course welcome her to contact us for further evaluation and management.    Duanne Guess 09/18/2019, 1:45 PM

## 2019-09-29 ENCOUNTER — Ambulatory Visit: Payer: Medicare HMO | Attending: Internal Medicine

## 2019-09-29 ENCOUNTER — Other Ambulatory Visit: Payer: Self-pay

## 2019-09-29 DIAGNOSIS — Z23 Encounter for immunization: Secondary | ICD-10-CM

## 2019-09-29 NOTE — Progress Notes (Signed)
   Covid-19 Vaccination Clinic  Name:  Holly Lewis    MRN: [id number] DOB: 04/24/1933  09/29/2019  Holly Lewis was observed post Covid-19 immunization for 15 minutes without incident. She was provided with Vaccine Information Sheet and instruction to access the V-Safe system.   Holly Lewis was instructed to call 911 with any severe reactions post vaccine: Marland Kitchen Difficulty breathing  . Swelling of face and throat  . A fast heartbeat  . A bad rash all over body  . Dizziness and weakness   Immunizations Administered    Name Date Dose VIS Date Route   Pfizer COVID-19 Vaccine 09/29/2019  8:46 AM 0.3 mL 05/30/2019 Intramuscular   Manufacturer: [employer], [employer]   Lot: [id number]   NDC: [address]-6

## 2019-10-21 ENCOUNTER — Ambulatory Visit: Payer: Medicare HMO | Attending: Internal Medicine

## 2019-10-21 DIAGNOSIS — Z23 Encounter for immunization: Secondary | ICD-10-CM

## 2019-10-21 NOTE — Progress Notes (Signed)
   Covid-19 Vaccination Clinic  Name:  Holly Lewis    MRN: [id number] DOB: [birth year]  10/21/2019  [name] was observed post Covid-19 immunization for 15 minutes without incident. She was provided with Vaccine Information Sheet and instruction to access the V-Safe system.   [name] was instructed to call 911 with any severe reactions post vaccine: Marland Kitchen Difficulty breathing  . Swelling of face and throat  . A fast heartbeat  . A bad rash all over body  . Dizziness and weakness   Immunizations Administered    Name Date Dose VIS Date Route   Pfizer COVID-19 Vaccine 10/21/2019 10:44 AM 0.3 mL 08/13/2018 Intramuscular   Manufacturer: [employer], [employer]   Lot: [id number]   NDC: [address]-0

## 2019-11-28 ENCOUNTER — Other Ambulatory Visit: Payer: Self-pay

## 2019-11-28 ENCOUNTER — Encounter: Payer: Self-pay | Admitting: Unknown Physician Specialty

## 2019-11-28 ENCOUNTER — Ambulatory Visit (INDEPENDENT_AMBULATORY_CARE_PROVIDER_SITE_OTHER): Payer: Medicare HMO | Admitting: Unknown Physician Specialty

## 2019-11-28 DIAGNOSIS — L209 Atopic dermatitis, unspecified: Secondary | ICD-10-CM | POA: Diagnosis not present

## 2019-11-28 DIAGNOSIS — R413 Other amnesia: Secondary | ICD-10-CM

## 2019-11-28 MED ORDER — PIMECROLIMUS 1 % EX CREA: TOPICAL_CREAM | Freq: Two times a day (BID) | CUTANEOUS | 2 refills | Status: DC

## 2019-11-28 NOTE — Progress Notes (Signed)
BP 140/81   Pulse (!) 102   Temp 97.6 F (36.4 C) (Oral)   Wt 186 lb (84.4 kg)   SpO2 93%   BMI 35.14 kg/m    Subjective:    Patient ID: Holly Lewis, female    DOB: 1932/09/20, 86 y.o.   MRN: [id number]  HPI: Holly Lewis is a 86 y.o. female  Chief Complaint  Patient presents with  . Rash    bilateral forearms for 2 weeks, states it does itch. Has used OTC cortisone with some relief but then she starts itching again shortly after   Pt is here with her son.    Pt with a rash for 2-3 weeks on lower legs and forearms.  The rash comes and goes and there is a note about it in [place].  The rash itches and burns.  Is using Clobetasol and used it for several days but seemed to be associated with a bad case of diarrhea and now using Cortisone.  States it is getting worse over the last several weeks.  Diagnosed as Atopic dermatitis.  Denies systemic symptoms of fatigue, headache or fever.    Memory: Son is concerned about memory.  Pt states her memory is "kind of" off.  She has some systems that compensate.  Margorie Lewis states she is repeating questions frequently.  Long term memory is OK.  Sometimes word salad.  Taking Prevagen without any help.    Relevant past medical, surgical, family and social history reviewed and updated as indicated. Interim medical history since our last visit reviewed. Allergies and medications reviewed and updated.  Review of Systems  Per HPI unless specifically indicated above     Objective:    BP 140/81   Pulse (!) 102   Temp 97.6 F (36.4 C) (Oral)   Wt 186 lb (84.4 kg)   SpO2 93%   BMI 35.14 kg/m   Wt Readings from Last 3 Encounters:  11/28/19 186 lb (84.4 kg)  09/18/19 188 lb 12.8 oz (85.6 kg)  09/05/19 190 lb (86.2 kg)    Physical Exam Constitutional:      General: She is not in acute distress.    Appearance: Normal appearance. She is well-developed.  HENT:     Head: Normocephalic and atraumatic.  Eyes:     General: Lids are normal.  No scleral icterus.       Right eye: No discharge.        Left eye: No discharge.     Conjunctiva/sclera: Conjunctivae normal.  Neck:     Vascular: No carotid bruit or JVD.  Cardiovascular:     Rate and Rhythm: Normal rate and regular rhythm.     Heart sounds: Normal heart sounds.  Pulmonary:     Effort: Pulmonary effort is normal.     Breath sounds: Normal breath sounds.  Abdominal:     Palpations: There is no hepatomegaly or splenomegaly.  Musculoskeletal:        General: Normal range of motion.     Cervical back: Normal range of motion and neck supple.  Skin:    General: Skin is warm and dry.     Coloration: Skin is not pale.     Findings: No rash.     Comments: Lower arm with redness and scaling.  Lower legs it is scattered.    Neurological:     Mental Status: She is alert and oriented to person, place, and time.  Psychiatric:        Behavior:  Behavior normal.        Thought Content: Thought content normal.        Judgment: Judgment normal.     Results for orders placed or performed during the hospital encounter of 09/06/19  Urinalysis, Complete w Microscopic  Result Value Ref Range   Color, Urine YELLOW (A) YELLOW   APPearance CLEAR (A) CLEAR   Specific Gravity, Urine 1.014 1.005 - 1.030   pH 6.0 5.0 - 8.0   Glucose, UA NEGATIVE NEGATIVE mg/dL   Hgb urine dipstick NEGATIVE NEGATIVE   Bilirubin Urine NEGATIVE NEGATIVE   Ketones, ur NEGATIVE NEGATIVE mg/dL   Protein, ur NEGATIVE NEGATIVE mg/dL   Nitrite NEGATIVE NEGATIVE   Leukocytes,Ua NEGATIVE NEGATIVE   WBC, UA 0-5 0 - 5 WBC/hpf   Bacteria, UA RARE (A) NONE SEEN   Squamous Epithelial / LPF 0-5 0 - 5   Mucus PRESENT   CBC  Result Value Ref Range   WBC 11.1 (H) 4.0 - 10.5 K/uL   RBC 4.15 3.87 - 5.11 MIL/uL   Hemoglobin 12.5 12.0 - 15.0 g/dL   HCT 27.0 36 - 46 %   MCV 90.6 80.0 - 100.0 fL   MCH 30.1 26.0 - 34.0 pg   MCHC 33.2 30.0 - 36.0 g/dL   RDW 62.3 76.2 - 83.1 %   Platelets 248 150 - 400 K/uL   nRBC  0.0 0.0 - 0.2 %  Comprehensive metabolic panel  Result Value Ref Range   Sodium 141 135 - 145 mmol/L   Potassium 3.9 3.5 - 5.1 mmol/L   Chloride 106 98 - 111 mmol/L   CO2 23 22 - 32 mmol/L   Glucose, Bld 144 (H) 70 - 99 mg/dL   BUN 18 8 - 23 mg/dL   Creatinine, Ser 5.17 0.44 - 1.00 mg/dL   Calcium 9.3 8.9 - 61.6 mg/dL   Total Protein 8.0 6.5 - 8.1 g/dL   Albumin 4.0 3.5 - 5.0 g/dL   AST 25 15 - 41 U/L   ALT 16 0 - 44 U/L   Alkaline Phosphatase 73 38 - 126 U/L   Total Bilirubin 0.5 0.3 - 1.2 mg/dL   GFR calc non Af Amer 58 (L) >60 mL/min   GFR calc Af Amer >60 >60 mL/min   Anion gap 12 5 - 15  Lipase, blood  Result Value Ref Range   Lipase 27 11 - 51 U/L      Assessment & Plan:   Problem List Items Addressed This Visit      Unprioritized   Atopic dermatitis    Current flare.  Will Start Elidil twice a day.  Recommended OTC 2nd generation anti-histamine-allegra, Zyrtec, or Clariten.  Emmolients such as Aquaphor for after bathing.        Relevant Orders   Vitamin B12   Memory impairment    Mini mental 27/30.  Discussed use of Aricept and benefits and side-effects.  Shared decision making to not start at this time due to hx of diarrhea with medication.  Take Vit D 2000 IUs/day.         Relevant Orders   Vitamin B12       Follow up plan:  F/U prn for memory impairment and if dermatitis is not improving

## 2019-11-28 NOTE — Patient Instructions (Signed)
Take oral anti-histamine- Zyrtec, Allegra, Clariton Start Elidil cream Use emollient such as Aquaphor after bathing.

## 2019-11-28 NOTE — Assessment & Plan Note (Signed)
Mini mental 27/30.  Discussed use of Aricept and benefits and side-effects.  Shared decision making to not start at this time due to hx of diarrhea with medication.  Take Vit D 2000 IUs/day.   

## 2019-11-28 NOTE — Assessment & Plan Note (Signed)
Current flare.  Will Start Elidil twice a day.  Recommended OTC 2nd generation anti-histamine-allegra, Zyrtec, or Clariten.  Emmolients such as Aquaphor for after bathing.

## 2019-11-29 LAB — VITAMIN B12: Vitamin B-12: 610 pg/mL (ref 232–1245)

## 2019-12-02 ENCOUNTER — Ambulatory Visit: Payer: Medicare HMO | Admitting: Nurse Practitioner

## 2019-12-03 ENCOUNTER — Encounter: Payer: Self-pay | Admitting: Nurse Practitioner

## 2019-12-03 ENCOUNTER — Other Ambulatory Visit: Payer: Self-pay

## 2019-12-03 ENCOUNTER — Ambulatory Visit (INDEPENDENT_AMBULATORY_CARE_PROVIDER_SITE_OTHER): Payer: Medicare HMO | Admitting: Nurse Practitioner

## 2019-12-03 DIAGNOSIS — F5101 Primary insomnia: Secondary | ICD-10-CM

## 2019-12-03 DIAGNOSIS — I10 Essential (primary) hypertension: Secondary | ICD-10-CM

## 2019-12-03 DIAGNOSIS — E7800 Pure hypercholesterolemia, unspecified: Secondary | ICD-10-CM | POA: Diagnosis not present

## 2019-12-03 DIAGNOSIS — N3941 Urge incontinence: Secondary | ICD-10-CM

## 2019-12-03 DIAGNOSIS — M8949 Other hypertrophic osteoarthropathy, multiple sites: Secondary | ICD-10-CM

## 2019-12-03 DIAGNOSIS — I480 Paroxysmal atrial fibrillation: Secondary | ICD-10-CM

## 2019-12-03 DIAGNOSIS — R413 Other amnesia: Secondary | ICD-10-CM | POA: Diagnosis not present

## 2019-12-03 DIAGNOSIS — M159 Polyosteoarthritis, unspecified: Secondary | ICD-10-CM

## 2019-12-03 NOTE — Assessment & Plan Note (Signed)
New diagnosis -- Discussed use of Aricept and benefits and side-effects.  Shared decision making to not start at this time due to hx of diarrhea with medications.  Continue Prevagen at this time and memory exercises at home.  Refuses imaging or neurology referral.

## 2019-12-03 NOTE — Assessment & Plan Note (Signed)
Chronic, ongoing.  Refuses statin and refuses labs today.  Continue to monitor.

## 2019-12-03 NOTE — Assessment & Plan Note (Signed)
Chronic, well controlled without medications.  She declines treatments or procedures, labs.  Continue to collaborate with cardiology.

## 2019-12-03 NOTE — Assessment & Plan Note (Signed)
Chronic, stable.  Continue current medication regimen and adjust as needed.  Reports good sleep pattern. 

## 2019-12-03 NOTE — Assessment & Plan Note (Signed)
Chronic, well controlled without medications, BP at goal in office and home for her age.  She declines treatment or labs today, appropriate based on her goals of care.  Continue to collaborate with cardiology. Recent CMP with stable kidney function noted.

## 2019-12-03 NOTE — Assessment & Plan Note (Signed)
Chronic, stable.  Refuses PT.  Continue simple treatment at home and adjust as needed.   

## 2019-12-03 NOTE — Patient Instructions (Signed)

## 2019-12-03 NOTE — Assessment & Plan Note (Signed)
Chronic, ongoing.  Consider reduction of Oxybutynin in future, change to Myrbetriq, and educated on Kegel exercises and decreasing water intake at night.  Return in 6 months.

## 2019-12-03 NOTE — Progress Notes (Signed)
BP 122/78 (BP Location: Left Arm)   Pulse 84   Temp 98.7 F (37.1 C) (Oral)   Wt 187 lb 12.8 oz (85.2 kg)   SpO2 94%   BMI 35.48 kg/m    Subjective:    Patient ID: Holly Lewis, female    DOB: March 24, 1933, 86 y.o.   MRN: [id number]  HPI: Holly Lewis is a 86 y.o. female  Chief Complaint  Patient presents with  . Hypertension  . Hyperlipidemia  . Urinary Incontinence  . Osteoarthritis   HYPERTENSION / HYPERLIPIDEMIA/A-FIB No current medications, refuses these. Saw cardiology on 07/04/2019 for her paroxysmal atrial fibrillation, she has declined anticoagulation or treatments.Seen recently in office for memory changes, did not want to start Aricept or pursue neurology.  Is taking Prevagen. Satisfied with current treatment?yes Duration of hypertension:chronic BP monitoring frequency:daily BP range:130/70's at home BP medication side effects:no Duration of hyperlipidemia:chronic Aspirin:no Recent stressors:no Recurrent headaches:no Visual changes:no Palpitations:no Dyspnea:no Chest pain:no Lower extremity edema:no Dizzy/lightheaded:no  OSTEOARTHRITIS: Reports Meloxicam helps when needed (usually once a week), at baseline has aches and pains here and there. Takes Tylenol occasionally.  Her arthritis ranges from hips to knees, plus occasionally other places. Does endorse being on the move often and working around house. Uses heat at times. Has not been using creams. Does not wish to go to physical therapy.   URGE INCONTINENCE: Takes Oxybutynin XL 15 MG at bedtime daily. Reports continues to get up to bathroom at least once a night, but is "pretty good" and much better. Does endorse when she gets up to bathroom she drinks a glass of water. At baseline drinks a lot of water during daytime. Denies UTI symptoms at this time. No polyuria, polydipsia, polyphagia.   INSOMNIA Continues on Doxepin and Melatonin for insomnia with no ADR. States she  sleeps very well. Duration: chronic Satisfied with sleep quality: yes Difficulty falling asleep: no Difficulty staying asleep: no Waking a few hours after sleep onset: no Early morning awakenings: no Daytime hypersomnolence: no Wakes feeling refreshed: no Good sleep hygiene: yes Apnea: no Snoring: no Depressed/anxious mood: no Recent stress: no Restless legs/nocturnal leg cramps: no Chronic pain/arthritis: yes History of sleep study: no Treatments attempted: melatonin   Relevant past medical, surgical, family and social history reviewed and updated as indicated. Interim medical history since our last visit reviewed. Allergies and medications reviewed and updated.  Review of Systems  Constitutional: Negative for activity change, appetite change, diaphoresis, fatigue and fever.  Respiratory: Negative for cough, chest tightness and shortness of breath.   Cardiovascular: Negative for chest pain, palpitations and leg swelling.  Gastrointestinal: Negative.   Musculoskeletal: Negative for arthralgias.  Skin: Negative for rash.  Neurological: Negative.   Psychiatric/Behavioral: Negative.     Per HPI unless specifically indicated above     Objective:    BP 122/78 (BP Location: Left Arm)   Pulse 84   Temp 98.7 F (37.1 C) (Oral)   Wt 187 lb 12.8 oz (85.2 kg)   SpO2 94%   BMI 35.48 kg/m   Wt Readings from Last 3 Encounters:  12/03/19 187 lb 12.8 oz (85.2 kg)  11/28/19 186 lb (84.4 kg)  09/18/19 188 lb 12.8 oz (85.6 kg)    Physical Exam Vitals and nursing note reviewed.  Constitutional:      General: She is awake. She is not in acute distress.    Appearance: She is well-developed and well-groomed. She is obese. She is not ill-appearing.  HENT:  Head: Normocephalic.     Right Ear: Hearing normal.     Left Ear: Hearing normal.  Eyes:     General: Lids are normal.        Right eye: No discharge.        Left eye: No discharge.     Conjunctiva/sclera: Conjunctivae  normal.     Pupils: Pupils are equal, round, and reactive to light.  Neck:     Thyroid: No thyromegaly.     Vascular: No carotid bruit.  Cardiovascular:     Rate and Rhythm: Normal rate and regular rhythm.     Heart sounds: Normal heart sounds. No murmur heard.  No gallop.   Pulmonary:     Effort: Pulmonary effort is normal. No accessory muscle usage or respiratory distress.     Breath sounds: Normal breath sounds.  Abdominal:     General: Bowel sounds are normal.     Palpations: Abdomen is soft.  Musculoskeletal:     Cervical back: Normal range of motion and neck supple.     Right lower leg: Edema (trace) present.     Left lower leg: Edema (trace) present.  Lymphadenopathy:     Cervical: No cervical adenopathy.  Skin:    General: Skin is warm and dry.  Neurological:     Mental Status: She is alert and oriented to person, place, and time.  Psychiatric:        Attention and Perception: Attention normal.        Mood and Affect: Mood normal.        Speech: Speech normal.        Behavior: Behavior normal. Behavior is cooperative.        Thought Content: Thought content normal.    Results for orders placed or performed in visit on 11/28/19  Vitamin B12  Result Value Ref Range   Vitamin B-12 610 232 - 1,245 pg/mL      Assessment & Plan:   Problem List Items Addressed This Visit      Cardiovascular and Mediastinum   Essential hypertension, benign    Chronic, well controlled without medications, BP at goal in office and home for her age.  She declines treatment or labs today, appropriate based on her goals of care.  Continue to collaborate with cardiology. Recent CMP with stable kidney function noted.      PAF (paroxysmal atrial fibrillation) (HCC)    Chronic, well controlled without medications.  She declines treatments or procedures, labs.  Continue to collaborate with cardiology.        Musculoskeletal and Integument   Osteoarthritis    Chronic, stable.  Refuses PT.   Continue simple treatment at home and adjust as needed.          Other   Hypercholesteremia    Chronic, ongoing.  Refuses statin and refuses labs today.  Continue to monitor.      Insomnia    Chronic, stable.  Continue current medication regimen and adjust as needed.  Reports good sleep pattern.      Urge incontinence of urine    Chronic, ongoing.  Consider reduction of Oxybutynin in future, change to Myrbetriq, and educated on Kegel exercises and decreasing water intake at night.  Return in 6 months.      Memory impairment    New diagnosis -- Discussed use of Aricept and benefits and side-effects.  Shared decision making to not start at this time due to hx of diarrhea with medications.  Continue  Prevagen at this time and memory exercises at home.  Refuses imaging or neurology referral.            Follow up plan: Return in about 6 months (around 06/03/2020) for HTN/HLD, OA, Urge incontinence, Memory changes.

## 2019-12-10 ENCOUNTER — Emergency Department: Payer: Medicare HMO

## 2019-12-10 ENCOUNTER — Other Ambulatory Visit: Payer: Self-pay

## 2019-12-10 ENCOUNTER — Emergency Department
Admission: EM | Admit: 2019-12-10 | Discharge: 2019-12-10 | Disposition: A | Discharge status: Home / Self Care | Payer: Medicare HMO | Attending: Emergency Medicine | Admitting: Emergency Medicine

## 2019-12-10 DIAGNOSIS — E860 Dehydration: Secondary | ICD-10-CM | POA: Diagnosis not present

## 2019-12-10 DIAGNOSIS — I10 Essential (primary) hypertension: Secondary | ICD-10-CM | POA: Diagnosis not present

## 2019-12-10 DIAGNOSIS — J90 Pleural effusion, not elsewhere classified: Secondary | ICD-10-CM | POA: Diagnosis not present

## 2019-12-10 DIAGNOSIS — Z79899 Other long term (current) drug therapy: Secondary | ICD-10-CM | POA: Insufficient documentation

## 2019-12-10 DIAGNOSIS — R42 Dizziness and giddiness: Secondary | ICD-10-CM | POA: Diagnosis not present

## 2019-12-10 DIAGNOSIS — I480 Paroxysmal atrial fibrillation: Secondary | ICD-10-CM | POA: Diagnosis not present

## 2019-12-10 DIAGNOSIS — R519 Headache, unspecified: Secondary | ICD-10-CM | POA: Diagnosis not present

## 2019-12-10 DIAGNOSIS — R079 Chest pain, unspecified: Secondary | ICD-10-CM | POA: Diagnosis not present

## 2019-12-10 LAB — BASIC METABOLIC PANEL
Anion gap: 10 (ref 5–15)
BUN: 16 mg/dL (ref 8–23)
CO2: 22 mmol/L (ref 22–32)
Calcium: 9.3 mg/dL (ref 8.9–10.3)
Chloride: 106 mmol/L (ref 98–111)
Creatinine, Ser: 0.88 mg/dL (ref 0.44–1.00)
GFR calc Af Amer: >60 mL/min (ref >60)
GFR calc non Af Amer: 60 mL/min — ABNORMAL LOW (ref >60)
Glucose, Bld: 124 mg/dL — ABNORMAL HIGH (ref 70–99)
Potassium: 3.7 mmol/L (ref 3.5–5.1)
Sodium: 138 mmol/L (ref 135–145)

## 2019-12-10 LAB — CBC
HCT: 38.8 % (ref 36.0–46.0)
Hemoglobin: 13 g/dL (ref 12.0–15.0)
MCH: 30.6 pg (ref 26.0–34.0)
MCHC: 33.5 g/dL (ref 30.0–36.0)
MCV: 91.3 fL (ref 80.0–100.0)
Platelets: 262 10*3/uL (ref 150–400)
RBC: 4.25 MIL/uL (ref 3.87–5.11)
RDW: 13.2 % (ref 11.5–15.5)
WBC: 8.2 10*3/uL (ref 4.0–10.5)
nRBC: 0 % (ref 0.0–0.2)

## 2019-12-10 LAB — TROPONIN I (HIGH SENSITIVITY)
Troponin I (High Sensitivity): 6 ng/L (ref <18)
Troponin I (High Sensitivity): 7 ng/L (ref <18)

## 2019-12-10 MED ORDER — SODIUM CHLORIDE 0.9 % IV BOLUS
1000.0000 mL | Freq: Once | INTRAVENOUS | Status: AC
  Administered 2019-12-10: 1000 mL via INTRAVENOUS

## 2019-12-10 MED ORDER — SODIUM CHLORIDE 0.9% FLUSH: 3.0000 mL | Freq: Once | INTRAVENOUS | Status: DC

## 2019-12-10 NOTE — ED Triage Notes (Signed)
Pt comes via POV from home with c/o sudden left sided chest pain. Pt states it has since resolved. Pt states little SOB and pain that was squeezing.  Pt denies any N.V

## 2019-12-10 NOTE — ED Notes (Signed)
Report to Butch, RN 

## 2019-12-10 NOTE — ED Provider Notes (Signed)
[facility] Emergency Department Provider Note  ____________________________________________  Time seen: Approximately 5:00 PM  I have reviewed the triage vital signs and the nursing notes.   HISTORY  Chief Complaint Dizziness   HPI Holly Lewis is a 86 y.o. female with a history of hypertension hyperlipidemia and paroxysmal atrial fibrillation who comes the ED complaining of dizziness that she first noticed in the middle the night.   Feels like motion and also like she might pass out when standing.  Worse with walking to the bathroom in the middle the night.  No syncope or fall or head injury.  Better lying down and being still.  Overnight no chest pain or shortness of breath.  No recent exertional symptoms.  Denies motor weakness paresthesias or vision change.  No headache.  On her way to the hospital today to be evaluated for the dizziness, she reports 2 brief episodes of chest pain that started just prior to arrival in the ED and also when walking up to check in at the ED.  Both episodes lasted only a few seconds, nonradiating, no diaphoresis or vomiting, no significant shortness of breath.  Felt like squeezing.     Past Medical History:  Diagnosis Date  . Anxiety   . Blood transfusion without reported diagnosis   . Cataract   . Chest pain    a. Pt reports nl stress test in 2016 in [place]; b. 05/2017 declined stress test for recurrent c/p.  Marland Kitchen Hyperlipidemia   . Hypertension   . PAF (paroxysmal atrial fibrillation) (HCC)    a. Dx in 2017 in [place] per pt-->s/p TEE/DCCV;  b. CHA2DS2VASc = 4-->Eliquis.     Patient Active Problem List   Diagnosis Date Noted  . Memory impairment 11/28/2019  . Calculus of gallbladder without cholecystitis without obstruction 09/18/2019  . Face lesion 08/13/2019  . PAF (paroxysmal atrial fibrillation) (HCC) 02/05/2019  . Osteoarthritis 05/28/2018  . Advanced care planning/counseling discussion 11/14/2017  . DNR (do not  resuscitate) 11/14/2017  . Atopic dermatitis 01/09/2017  . Urge incontinence of urine 01/09/2017  . Essential hypertension, benign 12/12/2016  . Hypercholesteremia 12/12/2016  . Insomnia 12/12/2016     Past Surgical History:  Procedure Laterality Date  . EYE SURGERY    . 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 kidney stones       Prior to Admission medications   Medication Sig Start Date End Date Taking? Authorizing Provider  Apoaequorin (PREVAGEN PO) Take by mouth daily.    [provider]  doxepin (SINEQUAN) 10 MG capsule Take 1 capsule (10 mg total) by mouth at bedtime. 02/05/19   Cannady, Corrie Dandy T, NP  Melatonin 10 MG TABS Take 10 mg by mouth at bedtime.    [provider]  meloxicam (MOBIC) 15 MG tablet Take 0.5 tablets (7.5 mg total) by mouth daily as needed. 03/03/19   Aura Dials T, NP  Multiple Vitamin (MULTIVITAMIN) tablet Take 1 tablet by mouth daily.    [provider]  oxybutynin (DITROPAN XL) 15 MG 24 hr tablet Take 1 tablet (15 mg total) by mouth at bedtime. 02/05/19   Cannady, Corrie Dandy T, NP  pimecrolimus (ELIDEL) 1 % cream Apply topically 2 (two) times daily. Patient not taking: Reported on 12/03/2019 11/28/19   Gabriel Cirri, NP     Allergies Diltiazem and Latex   Family History  Problem Relation Age of Onset  . Diabetes Mother   .  Diabetes Sister   . Cancer Sister        breast  . Alzheimer's disease Sister   . Diabetes Brother   . Cancer Brother        blood    Social History Social History   Tobacco Use  . Smoking status: Never Smoker  . Smokeless tobacco: Never Used  Vaping Use  . Vaping Use: Never used  Substance Use Topics  . Alcohol use: No  . Drug use: No    Review of Systems  Constitutional:   No fever or chills.  ENT:   No sore throat. No rhinorrhea. Cardiovascular: Positive fleeting chest pain as above without syncope.  No palpitations Respiratory:   No dyspnea  or cough. Gastrointestinal:   Negative for abdominal pain, vomiting and diarrhea.  Musculoskeletal:   Negative for focal pain or swelling All other systems reviewed and are negative except as documented above in ROS and HPI.  ____________________________________________   PHYSICAL EXAM:  VITAL SIGNS: ED Triage Vitals  Enc Vitals Group     BP 12/10/19 1131 (!) 189/106     Pulse Rate 12/10/19 1131 (!) 103     Resp 12/10/19 1131 18     Temp 12/10/19 1131 98.8 F (37.1 C)     Temp src --      SpO2 12/10/19 1131 96 %     Weight 12/10/19 1128 187 lb (84.8 kg)     Height 12/10/19 1128 5\' 1"  (1.549 m)     Head Circumference --      Peak Flow --      Pain Score 12/10/19 1128 0     Pain Loc --      Pain Edu? --      Excl. in GC? --     Vital signs reviewed, nursing assessments reviewed.   Constitutional:   Alert and oriented. Non-toxic appearance. Eyes:   Conjunctivae are normal. EOMI. PERRL.  No nystagmus ENT      Head:   Normocephalic and atraumatic.      Nose:   Normal      Mouth/Throat:   Dry mucous membranes.      Neck:   No meningismus. Full ROM. Hematological/Lymphatic/Immunilogical:   No cervical lymphadenopathy. Cardiovascular:   RRR, heart rate 90, increases to 110 with changing to upright position. Symmetric bilateral radial and DP pulses.  No murmurs. Cap refill less than 2 seconds. Respiratory:   Normal respiratory effort without tachypnea/retractions. Breath sounds are clear and equal bilaterally. No wheezes/rales/rhonchi. Gastrointestinal:   Soft and nontender. Non distended. There is no CVA tenderness.  No rebound, rigidity, or guarding.  Musculoskeletal:   Normal range of motion in all extremities. No joint effusions.  No lower extremity tenderness.  No edema. Neurologic:   Normal speech and language.  Motor grossly intact. No drift, normal finger-to-nose, normal heel-to-shin, normal sensation No acute focal neurologic deficits are appreciated.  NIH stroke  scale 0 Skin:    Skin is warm, dry and intact. No rash noted.  No petechiae, purpura, or bullae.  ____________________________________________    LABS (pertinent positives/negatives) (all labs ordered are listed, but only abnormal results are displayed) Labs Reviewed  BASIC METABOLIC PANEL - Abnormal; Notable for the following components:      Result Value   Glucose, Bld 124 (*)    GFR calc non Af Amer 60 (*)    All other components within normal limits  CBC  TROPONIN I (HIGH SENSITIVITY)  TROPONIN I (HIGH SENSITIVITY)  ____________________________________________   EKG  Interpreted by me Normal sinus rhythm rate of 93, left axis, normal intervals.  Poor R wave progression.  Normal ST segments.  Isolated T wave inversion in lead III which is nonspecific.  ____________________________________________    RADIOLOGY  DG Chest 2 View  Result Date: 12/10/2019 CLINICAL DATA:  Sudden onset left-sided chest pain EXAM: CHEST - 2 VIEW COMPARISON:  11/21/2017 FINDINGS: The lungs are clear without focal pneumonia, edema, pneumothorax or pleural effusion. Subtle changes of atelectasis or scarring noted in the lung bases. Cardiopericardial silhouette is at upper limits of normal for size. The visualized bony structures of the thorax are intact. IMPRESSION: No active cardiopulmonary disease. Electronically Signed   By: Kennith Center M.D.   On: 12/10/2019 12:19   CT HEAD WO CONTRAST  Result Date: 12/10/2019 CLINICAL DATA:  Left-sided chest pain and headache with dizziness. EXAM: CT HEAD WITHOUT CONTRAST TECHNIQUE: Contiguous axial images were obtained from the base of the skull through the vertex without intravenous contrast. COMPARISON:  None. FINDINGS: Brain: There is mild cerebral atrophy with widening of the extra-axial spaces and ventricular dilatation. There are areas of decreased attenuation within the white matter tracts of the supratentorial brain, consistent with microvascular disease  changes. Vascular: No hyperdense vessel or unexpected calcification. Skull: Normal. Negative for fracture or focal lesion. Sinuses/Orbits: No acute finding. Other: None. IMPRESSION: 1. Generalized cerebral atrophy. 2. No acute intracranial abnormality. Electronically Signed   By: Aram Candela M.D.   On: 12/10/2019 17:25    ____________________________________________   PROCEDURES .1-3 Lead EKG Interpretation Performed by: Sharman Cheek, MD Authorized by: Sharman Cheek, MD     ECG rate:  95   ECG rate assessment: normal     Rhythm: sinus rhythm     Ectopy: none     Conduction: normal      ____________________________________________  DIFFERENTIAL DIAGNOSIS   Dehydration, peripheral positional vertigo, non-STEMI, TIA/ischemic stroke, paroxysmal atrial fibrillation  CLINICAL IMPRESSION / ASSESSMENT AND PLAN / ED COURSE  Medications ordered in the ED: Medications  sodium chloride flush (NS) 0.9 % injection 3 mL (3 mLs Intravenous Not Given 12/10/19 1213)  sodium chloride 0.9 % bolus 1,000 mL (0 mLs Intravenous Stopped 12/10/19 1839)    Pertinent labs & imaging results that were available during my care of the patient were reviewed by me and considered in my medical decision making (see chart for details).  Holly Lewis was evaluated in Emergency Department on 12/10/2019 for the symptoms described in the history of present illness. She was evaluated in the context of the global COVID-19 pandemic, which necessitated consideration that the patient might be at risk for infection with the SARS-CoV-2 virus that causes COVID-19. Institutional protocols and algorithms that pertain to the evaluation of patients at risk for COVID-19 are in a state of rapid change based on information released by regulatory bodies including the CDC and federal and state organizations. These policies and algorithms were followed during the patient's care in the ED.   Patient presents with episodes  of dizziness at home.  Neuro exam is intact, but patient is unable to ambulate due to feeling very lightheaded with standing next to the treatment bed.  Currently in sinus rhythm.  Chest pain is noncardiac in nature, and 2 troponins and EKG are unremarkable.  Other labs unremarkable, chest x-ray unremarkable.   Considering the patient's symptoms, medical history, and physical examination today, I have low suspicion for ACS, PE, TAD, pneumothorax, carditis, mediastinitis, pneumonia, CHF, or  sepsis.  I will check a CT head for the dizziness episodes.  I suspect this is all due to dehydration we will give IV fluids and check orthostatics afterward.    Clinical Course as of Dec 09 2048  Wed Dec 10, 2019  1729 CT head unremarkable, no intracranial mass or evidence of bleeding.   [PS]  1940 Patient feeling much better after IV fluids.  Orthostatics are reassuring.  She is able to stand and ambulate without dizziness or feeling like she will pass out.  Eager to go home.  Stable for discharge, presentation consistent with dehydration.   [PS]    Clinical Course User Index [PS] Carrie Mew, MD     ____________________________________________   FINAL CLINICAL IMPRESSION(S) / ED DIAGNOSES    Final diagnoses:  Lightheadedness  Dehydration     ED Discharge Orders    None      Portions of this note were generated with dragon dictation software. Dictation errors may occur despite best attempts at proofreading.   Carrie Mew, MD 12/10/19 2050

## 2019-12-12 ENCOUNTER — Emergency Department
Admission: EM | Admit: 2019-12-12 | Discharge: 2019-12-12 | Disposition: A | Discharge status: Home / Self Care | Payer: Medicare HMO | Attending: Emergency Medicine | Admitting: Emergency Medicine

## 2019-12-12 ENCOUNTER — Other Ambulatory Visit: Payer: Self-pay

## 2019-12-12 ENCOUNTER — Encounter: Payer: Self-pay | Admitting: Psychiatry

## 2019-12-12 ENCOUNTER — Ambulatory Visit: Payer: Self-pay | Admitting: *Deleted

## 2019-12-12 DIAGNOSIS — Z91040 Latex allergy status: Secondary | ICD-10-CM | POA: Diagnosis not present

## 2019-12-12 DIAGNOSIS — I10 Essential (primary) hypertension: Secondary | ICD-10-CM | POA: Diagnosis not present

## 2019-12-12 DIAGNOSIS — R42 Dizziness and giddiness: Secondary | ICD-10-CM | POA: Diagnosis not present

## 2019-12-12 DIAGNOSIS — H81399 Other peripheral vertigo, unspecified ear: Secondary | ICD-10-CM

## 2019-12-12 DIAGNOSIS — R457 State of emotional shock and stress, unspecified: Secondary | ICD-10-CM | POA: Diagnosis not present

## 2019-12-12 LAB — CBC WITH DIFFERENTIAL/PLATELET
Abs Immature Granulocytes: 0.03 10*3/uL (ref 0.00–0.07)
Basophils Absolute: 0.1 10*3/uL (ref 0.0–0.1)
Basophils Relative: 1 %
Eosinophils Absolute: 0.2 10*3/uL (ref 0.0–0.5)
Eosinophils Relative: 3 %
HCT: 35.8 % — ABNORMAL LOW (ref 36.0–46.0)
Hemoglobin: 12 g/dL (ref 12.0–15.0)
Immature Granulocytes: 0 %
Lymphocytes Relative: 22 %
Lymphs Abs: 1.8 10*3/uL (ref 0.7–4.0)
MCH: 30.2 pg (ref 26.0–34.0)
MCHC: 33.5 g/dL (ref 30.0–36.0)
MCV: 90.2 fL (ref 80.0–100.0)
Monocytes Absolute: 0.9 10*3/uL (ref 0.1–1.0)
Monocytes Relative: 10 %
Neutro Abs: 5.4 10*3/uL (ref 1.7–7.7)
Neutrophils Relative %: 64 %
Platelets: 235 10*3/uL (ref 150–400)
RBC: 3.97 MIL/uL (ref 3.87–5.11)
RDW: 13.4 % (ref 11.5–15.5)
WBC: 8.4 10*3/uL (ref 4.0–10.5)
nRBC: 0 % (ref 0.0–0.2)

## 2019-12-12 LAB — BASIC METABOLIC PANEL
Anion gap: 9 (ref 5–15)
BUN: 17 mg/dL (ref 8–23)
CO2: 25 mmol/L (ref 22–32)
Calcium: 9.2 mg/dL (ref 8.9–10.3)
Chloride: 106 mmol/L (ref 98–111)
Creatinine, Ser: 0.8 mg/dL (ref 0.44–1.00)
GFR calc Af Amer: >60 mL/min (ref >60)
GFR calc non Af Amer: >60 mL/min (ref >60)
Glucose, Bld: 106 mg/dL — ABNORMAL HIGH (ref 70–99)
Potassium: 4.4 mmol/L (ref 3.5–5.1)
Sodium: 140 mmol/L (ref 135–145)

## 2019-12-12 LAB — TROPONIN I (HIGH SENSITIVITY)
Troponin I (High Sensitivity): 6 ng/L (ref <18)
Troponin I (High Sensitivity): 8 ng/L (ref <18)

## 2019-12-12 MED ORDER — MECLIZINE HCL 25 MG PO TABS: 25.0000 mg | ORAL_TABLET | Freq: Three times a day (TID) | ORAL | 0 refills | Status: DC | PRN

## 2019-12-12 MED ORDER — MECLIZINE HCL 25 MG PO TABS
25.0000 mg | ORAL_TABLET | Freq: Once | ORAL | Status: AC
  Administered 2019-12-12: 25 mg via ORAL
  Filled 2019-12-12: qty 1

## 2019-12-12 NOTE — Telephone Encounter (Signed)
Noted and agree with plan -- will follow ER notes

## 2019-12-12 NOTE — ED Triage Notes (Signed)
Patient arrived via EMS from home with c/o of dizziness for 2 days, patient said she has to walk around her house and grab onto walls. No chest pain. Patient was here 2 days ago seen for same

## 2019-12-12 NOTE — ED Notes (Signed)
Patient son at bedside

## 2019-12-12 NOTE — ED Notes (Signed)
Labs drawn by EDT NB

## 2019-12-12 NOTE — ED Notes (Addendum)
Patient and Clinical research associate ambulated in the hall per MD [facility] request. Patient gait was unsteady (patient stated I feel wobbly) and patient appeared SOB, patient stated she usually uses a cane but feels that even if she had the cane it would not have made a difference

## 2019-12-12 NOTE — ED Notes (Signed)
Labs held up due to hard stick. MD notified

## 2019-12-12 NOTE — ED Provider Notes (Signed)
[facility] Emergency Department Provider Note ____________________________________________   First MD Initiated Contact with Patient 12/12/19 1004     (approximate)  I have reviewed the triage vital signs and the nursing notes.   HISTORY  Chief Complaint Dizziness    HPI Holly Lewis is a 86 y.o. female with PMH as noted below including hypertension with hyperlipidemia, and paroxysmal atrial fibrillation who presents with dizziness, acute onset while in bed, and described as a sensation of movement or spinning.  The patient states that it feels like the bed is going to fall over.  She states it is worse when she tries to get up and walk around, and she needs to hold onto walls.  She states that she does not feel lightheaded or like she might pass out.  She was seen in the ED for this few days ago and it resolved.  She states she was feeling well yesterday, but then the symptoms returned this morning.  Before this week she denies any prior history of similar episodes.  She denies any headache, vision changes, weakness or numbness, or other acute symptoms.  Past Medical History:  Diagnosis Date  . Anxiety   . Blood transfusion without reported diagnosis   . Cataract   . Chest pain    a. Pt reports nl stress test in 2016 in [place]; b. 05/2017 declined stress test for recurrent c/p.  Marland Kitchen Hyperlipidemia   . Hypertension   . PAF (paroxysmal atrial fibrillation) (HCC)    a. Dx in 2017 in [place] per pt-->s/p TEE/DCCV;  b. CHA2DS2VASc = 4-->Eliquis.    Patient Active Problem List   Diagnosis Date Noted  . Memory impairment 11/28/2019  . Calculus of gallbladder without cholecystitis without obstruction 09/18/2019  . Face lesion 08/13/2019  . PAF (paroxysmal atrial fibrillation) (HCC) 02/05/2019  . Osteoarthritis 05/28/2018  . Advanced care planning/counseling discussion 11/14/2017  . DNR (do not resuscitate) 11/14/2017  . Atopic dermatitis 01/09/2017  . Urge  incontinence of urine 01/09/2017  . Essential hypertension, benign 12/12/2016  . Hypercholesteremia 12/12/2016  . Insomnia 12/12/2016    Past Surgical History:  Procedure Laterality Date  . EYE SURGERY    . 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 kidney stones      Prior to Admission medications   Medication Sig Start Date End Date Taking? Authorizing Provider  Apoaequorin (PREVAGEN PO) Take by mouth daily.   Yes [provider]  doxepin (SINEQUAN) 10 MG capsule Take 1 capsule (10 mg total) by mouth at bedtime. 02/05/19  Yes Cannady, Jolene T, NP  Melatonin 10 MG TABS Take 10 mg by mouth at bedtime.   Yes [provider]  Multiple Vitamin (MULTIVITAMIN) tablet Take 1 tablet by mouth daily.   Yes [provider]  oxybutynin (DITROPAN XL) 15 MG 24 hr tablet Take 1 tablet (15 mg total) by mouth at bedtime. 02/05/19  Yes Cannady, Corrie Dandy T, NP  meclizine (ANTIVERT) 25 MG tablet Take 1 tablet (25 mg total) by mouth 3 (three) times daily as needed for dizziness. 12/12/19   Dionne Bucy, MD    Allergies Diltiazem and Latex  Family History  Problem Relation Age of Onset  . Diabetes Mother   . Diabetes Sister   . Cancer Sister        breast  . Alzheimer's disease Sister   . Diabetes Brother   . Cancer Brother  blood    Social History Social History   Tobacco Use  . Smoking status: Never Smoker  . Smokeless tobacco: Never Used  Vaping Use  . Vaping Use: Never used  Substance Use Topics  . Alcohol use: No  . Drug use: No    Review of Systems  Constitutional: No fever/chills. Eyes: No visual changes. ENT: No sore throat. Cardiovascular: Denies chest pain. Respiratory: Denies shortness of breath. Gastrointestinal: No nausea or vomiting. Genitourinary: Negative for dysuria.  Musculoskeletal: Negative for back pain. Skin: Negative for rash. Neurological: Negative for headaches, focal weakness or  numbness.   ____________________________________________   PHYSICAL EXAM:  VITAL SIGNS: ED Triage Vitals  Enc Vitals Group     BP 12/12/19 1010 (!) 169/85     Pulse Rate 12/12/19 1010 91     Resp 12/12/19 1010 (!) 30     Temp 12/12/19 1010 97.7 F (36.5 C)     Temp Source 12/12/19 1010 Oral     SpO2 12/12/19 1010 98 %     Weight 12/12/19 1011 186 lb 15.2 oz (84.8 kg)     Height 12/12/19 1011 5\' 1"  (1.549 m)     Head Circumference --      Peak Flow --      Pain Score 12/12/19 1011 0     Pain Loc --      Pain Edu? --      Excl. in [place]? --     Constitutional: Alert and oriented. Well appearing for age and in no acute distress. Eyes: Conjunctivae are normal.  EOMI.  PERRLA.  Mild nystagmus bilaterally. Head: Atraumatic.  Ear canals and TMs appear normal bilaterally. Nose: No congestion/rhinnorhea. Mouth/Throat: Mucous membranes are moist.   Neck: Normal range of motion.  Cardiovascular: Normal rate, regular rhythm. Grossly normal heart sounds.  Good peripheral circulation. Respiratory: Normal respiratory effort.  No retractions. Lungs CTAB. Gastrointestinal: Soft and nontender. No distention.  Genitourinary: No flank tenderness. Musculoskeletal: No lower extremity edema.  Extremities warm and well perfused.  Neurologic:  Normal speech and language.  5/5 motor strength and intact sensation to all extremities.  Normal coordination with no ataxia on finger-to-nose.  No facial droop.  No pronator drift. Skin:  Skin is warm and dry. No rash noted. Psychiatric: Mood and affect are normal. Speech and behavior are normal.  ____________________________________________   LABS (all labs ordered are listed, but only abnormal results are displayed)  Labs Reviewed  BASIC METABOLIC PANEL - Abnormal; Notable for the following components:      Result Value   Glucose, Bld 106 (*)    All other components within normal limits  CBC WITH DIFFERENTIAL/PLATELET - Abnormal; Notable for the  following components:   HCT 35.8 (*)    All other components within normal limits  TROPONIN I (HIGH SENSITIVITY)  TROPONIN I (HIGH SENSITIVITY)   ____________________________________________  EKG  ED ECG REPORT I, Arta Silence, the attending physician, personally viewed and interpreted this ECG.  Date: 12/12/2019 EKG Time: 1146 Rate: 70 Rhythm: normal sinus rhythm QRS Axis: normal Intervals: normal ST/T Wave abnormalities: normal Narrative Interpretation: no evidence of acute ischemia  ____________________________________________  RADIOLOGY    ____________________________________________   PROCEDURES  Procedure(s) performed: No  Procedures  Critical Care performed: No ____________________________________________   INITIAL IMPRESSION / ASSESSMENT AND PLAN / ED COURSE  Pertinent labs & imaging results that were available during my care of the patient were reviewed by me and considered in my medical decision making (see chart  for details).  86 year old female with PMH as noted above presents with recurrent dizziness which she primarily describes as a movement or spinning sensation and is worse when she is moving around, although present when she is in bed.  She was seen in the ED a few days ago for this and the symptoms resolved yesterday, but then returned.  I reviewed the past medical records in Epic.  The patient was seen in the ED on 6/23 with a similar presentation although seem to describe more of a presyncopal type of sensation at that time.  She had a reassuring lab work-up, normal CT head for age, and her symptoms resolved after fluid bolus.  She was not sent home on any medications.  On exam today, she is overall well-appearing.  Her vital signs are normal except for hypertension.  Neurologic exam is nonfocal.  Overall presentation is most consistent with peripheral vertigo given the more specific description of a movement sensation.  Patient also  describes some popping in the right ear although her ear exam is normal.  I have a low suspicion for central vertigo given the episodic and positional nature of the symptoms and the negative CT head 2 days ago.  The patient has no evidence of acute CVA.  We will obtain repeat labs, give meclizine, and reassess.  There is no indication for repeat imaging at this time.  ----------------------------------------- 2:56 PM on 12/12/2019 -----------------------------------------  The patient reported significant improvement in her symptoms after meclizine.  Lab work-up was obtained and is unremarkable.  We attempted to have the patient ambulate and she was still feeling somewhat dizzy and wobbly, but reported that the symptoms had improved.  I initially offered observation admission, however the patient strongly prefers to go home, which I think is reasonable.  At this time, there is no evidence of central vertigo, acute stroke, cardiac etiology, or other concerning cause of the patient's symptoms.  Her presentation is consistent with peripheral vertigo.  I counseled her on the results of the work-up.  I gave her neurology referral and advised her to follow-up with her PMD, and have prescribed meclizine for symptomatic control.  I gave her very thorough return precautions, and she expressed understanding.   ____________________________________________   FINAL CLINICAL IMPRESSION(S) / ED DIAGNOSES  Final diagnoses:  Peripheral vertigo, unspecified laterality      NEW MEDICATIONS STARTED DURING THIS VISIT:  New Prescriptions   MECLIZINE (ANTIVERT) 25 MG TABLET    Take 1 tablet (25 mg total) by mouth 3 (three) times daily as needed for dizziness.     Note:  This document was prepared using Dragon voice recognition software and may include unintentional dictation errors.    Dionne Bucy, MD 12/12/19 [phone]

## 2019-12-12 NOTE — Telephone Encounter (Signed)
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   Reason for Disposition . SEVERE difficulty breathing (e.g., struggling for each breath, speaks in single words)  Answer Assessment - Initial Assessment Questions 1. DESCRIPTION: "Describe your dizziness."     lightheaded 2. LIGHTHEADED: "Do you feel lightheaded?" (e.g., somewhat faint, woozy, weak upon standing)     Feels like she is going to fall 3. VERTIGO: "Do you feel like either you or the room is spinning or tilting?" (i.e. vertigo)    yes 4. SEVERITY: "How bad is it?"  "Do you feel like you are going to faint?" "Can you stand and walk?"   - MILD: Feels slightly dizzy, but walking normally.   - MODERATE: Feels very unsteady when walking, but not falling; interferes with normal activities (e.g., school, work) .   - SEVERE: Unable to walk without falling, or requires assistance to walk without falling; feels like passing out now.      severe 5. ONSET:  "When did the dizziness begin?"     Seen in ED 12/10/19 6. AGGRAVATING FACTORS: "Does anything make it worse?" (e.g., standing, change in head position)     constant 7. HEART RATE: "Can you tell me your heart rate?" "How many beats in 15 seconds?"  (Note: not all patients can do this)       Pt can not complete this task 8. CAUSE: "What do you think is causing the dizziness?"     Not sure 9. RECURRENT SYMPTOM: "Have you had dizziness before?" If Yes, ask: "When was the last time?" "What happened that time?" Seen in ED 12/10/19 10. OTHER SYMPTOMS: "Do you have any other symptoms?" (e.g., fever, chest pain, vomiting, diarrhea, bleeding)      Rapid  breathing 11. PREGNANCY: "Is there any chance you are pregnant?" "When was your last menstrual period?"       no  Protocols used: DIZZINESS [facility]

## 2019-12-12 NOTE — Discharge Instructions (Addendum)
You may take the meclizine as needed for dizziness over the next several days.  You should follow-up with your primary care doctor within the next week.  You may also follow-up with a neurologist if your symptoms persist or he have episodes like this in the future.  A referral has been provided.  In the meantime, return to the ER immediately for new, worsening, or persistent severe dizziness, inability to walk, vision changes, difficulty with speech, weakness or numbness, or any other new or worsening symptoms that concern you.

## 2019-12-12 NOTE — ED Notes (Addendum)
Patient discharged home with son, patient received discharge papers and prescription. Patient has belongings. Patient appropriate and cooperative. Vital signs taken. NAD noted.

## 2019-12-15 ENCOUNTER — Ambulatory Visit (INDEPENDENT_AMBULATORY_CARE_PROVIDER_SITE_OTHER): Payer: Medicare HMO | Admitting: Nurse Practitioner

## 2019-12-15 ENCOUNTER — Other Ambulatory Visit: Payer: Self-pay

## 2019-12-15 ENCOUNTER — Encounter: Payer: Self-pay | Admitting: Nurse Practitioner

## 2019-12-15 VITALS — BP 122/84 | HR 94 | Temp 98.6°F | Wt 187.6 lb

## 2019-12-15 DIAGNOSIS — L209 Atopic dermatitis, unspecified: Secondary | ICD-10-CM | POA: Diagnosis not present

## 2019-12-15 DIAGNOSIS — H81393 Other peripheral vertigo, bilateral: Secondary | ICD-10-CM | POA: Diagnosis not present

## 2019-12-15 MED ORDER — TRIAMCINOLONE ACETONIDE 0.1 % EX CREA
1.0000 | TOPICAL_CREAM | Freq: Two times a day (BID) | CUTANEOUS | 0 refills | Status: DC
Start: 2019-12-15 — End: 2020-01-01

## 2019-12-15 MED ORDER — PREDNISONE 10 MG PO TABS: ORAL_TABLET | ORAL | 0 refills | Status: DC

## 2019-12-15 MED ORDER — MECLIZINE HCL 25 MG PO TABS: 25.0000 mg | ORAL_TABLET | Freq: Three times a day (TID) | ORAL | 1 refills | Status: DC | PRN

## 2019-12-15 NOTE — Patient Instructions (Signed)
Start using Triamcinolone cream and taking Prednisone as instructed -- please have you or son call and let me know the date you see skin doctor in August.     Rash, Adult  A rash is a change in the color of your skin. A rash can also change the way your skin feels. There are many different conditions and factors that can cause a rash. Follow these instructions at home: The goal of treatment is to stop the itching and keep the rash from spreading. Watch for any changes in your symptoms. Let your doctor know about them. Follow these instructions to help with your condition: Medicine Take or apply over-the-counter and prescription medicines only as told by your doctor. These may include medicines:  To treat red or swollen skin (corticosteroid creams).  To treat itching.  To treat an allergy (oral antihistamines).  To treat very bad symptoms (oral corticosteroids).  Skin care  Put cool cloths (compresses) on the affected areas.  Do not scratch or rub your skin.  Avoid covering the rash. Make sure that the rash is exposed to air as much as possible. Managing itching and discomfort  Avoid hot showers or baths. These can make itching worse. A cold shower may help.  Try taking a bath with: ? Epsom salts. You can get these at your local pharmacy or grocery store. Follow the instructions on the package. ? Baking soda. Pour a small amount into the bath as told by your doctor. ? Colloidal oatmeal. You can get this at your local pharmacy or grocery store. Follow the instructions on the package.  Try putting baking soda paste onto your skin. Stir water into baking soda until it gets like a paste.  Try putting on a lotion that relieves itchiness (calamine lotion).  Keep cool and out of the sun. Sweating and being hot can make itching worse. General instructions   Rest as needed.  Drink enough fluid to keep your pee (urine) pale yellow.  Wear loose-fitting clothing.  Avoid scented  soaps, detergents, and perfumes. Use gentle soaps, detergents, perfumes, and other cosmetic products.  Avoid anything that causes your rash. Keep a journal to help track what causes your rash. Write down: ? What you eat. ? What cosmetic products you use. ? What you drink. ? What you wear. This includes jewelry.  Keep all follow-up visits as told by your doctor. This is important. Contact a doctor if:  You sweat at night.  You lose weight.  You pee (urinate) more than normal.  You pee less than normal, or you notice that your pee is a darker color than normal.  You feel weak.  You throw up (vomit).  Your skin or the whites of your eyes look yellow (jaundice).  Your skin: ? Tingles. ? Is numb.  Your rash: ? Does not go away after a few days. ? Gets worse.  You are: ? More thirsty than normal. ? More tired than normal.  You have: ? New symptoms. ? Pain in your belly (abdomen). ? A fever. ? Watery poop (diarrhea). Get help right away if:  You have a fever and your symptoms suddenly get worse.  You start to feel mixed up (confused).  You have a very bad headache or a stiff neck.  You have very bad joint pains or stiffness.  You have jerky movements that you cannot control (seizure).  Your rash covers all or most of your body. The rash may or may not be painful.  You have blisters that: ? Are on top of the rash. ? Grow larger. ? Grow together. ? Are painful. ? Are inside your nose or mouth.  You have a rash that: ? Looks like purple pinprick-sized spots all over your body. ? Has a "bull's eye" or looks like a target. ? Is red and painful, causes your skin to peel, and is not from being in the sun too long. Summary  A rash is a change in the color of your skin. A rash can also change the way your skin feels.  The goal of treatment is to stop the itching and keep the rash from spreading.  Take or apply over-the-counter and prescription medicines only  as told by your doctor.  Contact a doctor if you have new symptoms or symptoms that get worse.  Keep all follow-up visits as told by your doctor. This is important. This information is not intended to replace advice given to you by your health care provider. Make sure you discuss any questions you have with your health care provider. Document Revised: 09/27/2018 Document Reviewed: 01/07/2018 Elsevier Patient Education  2020 [employer].

## 2019-12-15 NOTE — Assessment & Plan Note (Signed)
Ongoing with no improvement.  Will switch to Triamcinolone cream to use BID and script for Prednisone sent.  She is scheduled to see dermatology in August per her report, may need to assist in moving this appointment forward if ongoing issues.  Recommend she take a daily Claritin or Allegra for pruritus and avoid scratching areas + use oatmeal baths.  Return to office in 6 weeks, sooner if worsening symptoms.

## 2019-12-15 NOTE — Progress Notes (Signed)
BP 122/84   Pulse 94   Temp 98.6 F (37 C) (Oral)   Wt 187 lb 9.6 oz (85.1 kg)   SpO2 96%   BMI 35.45 kg/m    Subjective:    Patient ID: Holly Lewis, female    DOB: 10/22/1932, 86 y.o.   MRN: [id number]  HPI: Holly Lewis is a 86 y.o. female  Chief Complaint  Patient presents with  . ER Follow Up    pt states she was in the ER on 12/10/19 and 12/12/19 for dizziness- given meclizine. States she has only had 1 epsiode of dizziness since the incident   . Rash    pt states she has a rash on her forarms, legs, and back that has been there for a while. States it is itchy    ER FOLLOW UP Seen in ER 12/10/19 and 12/12/19 for peripheral vertigo.  Chest x-ray on 12/10/19 showed no active cardiopulmonary disease and CT head noted mild cerebral atrophy and microvascular disease, but no acute intracranial abnormality.  Labs in hospital negative troponin, no anemia, and normal kidney function.  EKG at recent visit with ER noted NSR.  Was given Meclizine with benefit in ER -- was prescribed this and given a referral to neurology.    Had one episode last -- has been taking Meclizine 3 times a day.  Has two pills left.  Reports she is overall feeling better with less episodes of dizziness.  No popping or ear pain.   Time since discharge: 3 days Hospital/facility: [facility] Diagnosis: peripheral vertigo Procedures/tests: as noted above Consultants: none New medications: Meclizine Discharge instructions:  Follow-up with PCP and neurology Status: better  RASH Was seen for this on 11/28/19 and given prescription for Pimecrolimus 1% cream -- has been using this every day she reports.  Reports this is ongoing to forearms, legs, and neck.  Now has areas on legs.  She is going to see dermatology in August.   Duration:  weeks  Location: generalized  Itching: yes Burning: no Redness: yes Oozing: no Scaling: no Blisters: no Painful: no Fevers: no Change in detergents/soaps/personal care products:  no Recent illness: no Recent travel:no History of same: no Context: fluctuating Alleviating factors: nothing Treatments attempted: Pimecrolimus 1% cream  Shortness of breath: no  Throat/tongue swelling: no Myalgias/arthralgias: no  Relevant past medical, surgical, family and social history reviewed and updated as indicated. Interim medical history since our last visit reviewed. Allergies and medications reviewed and updated.  Review of Systems  Constitutional: Negative for activity change, appetite change, diaphoresis, fatigue and fever.  Respiratory: Negative for cough, chest tightness and shortness of breath.   Cardiovascular: Negative for chest pain, palpitations and leg swelling.  Gastrointestinal: Negative.   Musculoskeletal: Negative for arthralgias.  Skin: Positive for rash.  Neurological: Negative.   Psychiatric/Behavioral: Negative.     Per HPI unless specifically indicated above     Objective:    BP 122/84   Pulse 94   Temp 98.6 F (37 C) (Oral)   Wt 187 lb 9.6 oz (85.1 kg)   SpO2 96%   BMI 35.45 kg/m   Wt Readings from Last 3 Encounters:  12/15/19 187 lb 9.6 oz (85.1 kg)  12/12/19 186 lb 15.2 oz (84.8 kg)  12/10/19 187 lb (84.8 kg)    Physical Exam Vitals and nursing note reviewed.  Constitutional:      General: She is awake. She is not in acute distress.    Appearance: She is well-developed and well-groomed.  She is obese. She is not ill-appearing.  HENT:     Head: Normocephalic.     Right Ear: Hearing, tympanic membrane, ear canal and external ear normal.     Left Ear: Hearing, tympanic membrane, ear canal and external ear normal.  Eyes:     General: Lids are normal.        Right eye: No discharge.        Left eye: No discharge.     Conjunctiva/sclera: Conjunctivae normal.     Pupils: Pupils are equal, round, and reactive to light.  Neck:     Thyroid: No thyromegaly.     Vascular: No carotid bruit.  Cardiovascular:     Rate and Rhythm: Normal  rate and regular rhythm.     Heart sounds: Normal heart sounds. No murmur heard.  No gallop.   Pulmonary:     Effort: Pulmonary effort is normal. No accessory muscle usage or respiratory distress.     Breath sounds: Normal breath sounds.  Abdominal:     General: Bowel sounds are normal.     Palpations: Abdomen is soft.  Musculoskeletal:     Cervical back: Normal range of motion and neck supple.     Right lower leg: No edema.     Left lower leg: No edema.  Lymphadenopathy:     Cervical: No cervical adenopathy.  Skin:    General: Skin is warm and dry.     Findings: Rash present.     Comments: Splotchy areas of rash, flat, moderate erythema to bilateral forearms, anterior right side neck, and bilateral lower legs.  No vesicles.  Skin intact.  No scaling noted.  Neurological:     Mental Status: She is alert.     Cranial Nerves: Cranial nerves are intact.     Motor: Motor function is intact.     Coordination: Coordination is intact.     Gait: Gait is intact.     Deep Tendon Reflexes: Reflexes are normal and symmetric.     Reflex Scores:      Brachioradialis reflexes are 2+ on the right side and 2+ on the left side.      Patellar reflexes are 2+ on the right side and 2+ on the left side.    Comments: Oriented to year, month, day, and provider.  Uses cane to walk.  Unable to recreate dizziness on exam today.  Psychiatric:        Attention and Perception: Attention normal.        Mood and Affect: Mood normal.        Speech: Speech normal.        Behavior: Behavior normal. Behavior is cooperative.        Thought Content: Thought content normal.     Results for orders placed or performed during the hospital encounter of 12/12/19  Basic metabolic panel  Result Value Ref Range   Sodium 140 135 - 145 mmol/L   Potassium 4.4 3.5 - 5.1 mmol/L   Chloride 106 98 - 111 mmol/L   CO2 25 22 - 32 mmol/L   Glucose, Bld 106 (H) 70 - 99 mg/dL   BUN 17 8 - 23 mg/dL   Creatinine, Ser 6.96 0.44 -  1.00 mg/dL   Calcium 9.2 8.9 - 29.5 mg/dL   GFR calc non Af Amer >60 >60 mL/min   GFR calc Af Amer >60 >60 mL/min   Anion gap 9 5 - 15  CBC with Differential  Result Value Ref  Range   WBC 8.4 4.0 - 10.5 K/uL   RBC 3.97 3.87 - 5.11 MIL/uL   Hemoglobin 12.0 12.0 - 15.0 g/dL   HCT 16.1 (L) 36 - 46 %   MCV 90.2 80.0 - 100.0 fL   MCH 30.2 26.0 - 34.0 pg   MCHC 33.5 30.0 - 36.0 g/dL   RDW 09.6 04.5 - 40.9 %   Platelets 235 150 - 400 K/uL   nRBC 0.0 0.0 - 0.2 %   Neutrophils Relative % 64 %   Neutro Abs 5.4 1.7 - 7.7 K/uL   Lymphocytes Relative 22 %   Lymphs Abs 1.8 0.7 - 4.0 K/uL   Monocytes Relative 10 %   Monocytes Absolute 0.9 0 - 1 K/uL   Eosinophils Relative 3 %   Eosinophils Absolute 0.2 0 - 0 K/uL   Basophils Relative 1 %   Basophils Absolute 0.1 0 - 0 K/uL   Immature Granulocytes 0 %   Abs Immature Granulocytes 0.03 0.00 - 0.07 K/uL  Troponin I (High Sensitivity)  Result Value Ref Range   Troponin I (High Sensitivity) 6 <18 ng/L  Troponin I (High Sensitivity)  Result Value Ref Range   Troponin I (High Sensitivity) 8 <18 ng/L      Assessment & Plan:   Problem List Items Addressed This Visit      Nervous and Auditory   Peripheral vertigo of both ears - Primary    Ongoing, improving at this time per patient report with addition of Meclizine.  Recommended she only use this as needed and not scheduled, only use of episode of dizziness presents.  Will place referral to neurology, as recommended by ER -- this will benefit both recent vertigo and memory changes.  Refills sent on Meclizine.  Return to office in 6 weeks, sooner if worsening symptoms.        Relevant Orders   Ambulatory referral to Neurology     Musculoskeletal and Integument   Atopic dermatitis    Ongoing with no improvement.  Will switch to Triamcinolone cream to use BID and script for Prednisone sent.  She is scheduled to see dermatology in August per her report, may need to assist in moving this  appointment forward if ongoing issues.  Recommend she take a daily Claritin or Allegra for pruritus and avoid scratching areas + use oatmeal baths.  Return to office in 6 weeks, sooner if worsening symptoms.          Follow up plan: Return in about 6 weeks (around 01/26/2020) for Rash and vertigo.

## 2019-12-15 NOTE — Assessment & Plan Note (Signed)
Ongoing, improving at this time per patient report with addition of Meclizine.  Recommended she only use this as needed and not scheduled, only use of episode of dizziness presents.  Will place referral to neurology, as recommended by ER -- this will benefit both recent vertigo and memory changes.  Refills sent on Meclizine.  Return to office in 6 weeks, sooner if worsening symptoms.

## 2020-01-01 ENCOUNTER — Ambulatory Visit: Payer: Medicare HMO | Admitting: Cardiovascular Disease

## 2020-01-01 ENCOUNTER — Other Ambulatory Visit: Payer: Self-pay

## 2020-01-01 ENCOUNTER — Encounter: Payer: Self-pay | Admitting: Cardiovascular Disease

## 2020-01-01 VITALS — BP 140/80 | HR 107 | Ht 61.0 in | Wt 190.1 lb

## 2020-01-01 DIAGNOSIS — I10 Essential (primary) hypertension: Secondary | ICD-10-CM

## 2020-01-01 DIAGNOSIS — I480 Paroxysmal atrial fibrillation: Secondary | ICD-10-CM | POA: Diagnosis not present

## 2020-01-01 NOTE — Progress Notes (Signed)
Cardiology Office Note   Date:  01/01/2020   ID:  Holly Lewis, DOB December 26, 1932, MRN [id number]  PCP:  Marjie Skiff, NP  Cardiologist:   Lorine Bears, MD   Chief Complaint  Patient presents with  . other    6 month f/u no complaints today. Meds reviewed verbally with pt.      History of Present Illness: Holly Lewis is a 86 y.o. female who is here for a follow up visit regarding paroxysmal atrial fibrillation. She has other chronic medical conditions including hypertension, hyperlipidemia, cataracts, anxiety, and chest pain.  She reportedly underwent stress testing in 2016 which was normal.  Paroxysmal atrial fibrillation was apparently diagnosed in 2017 and she is previously described to [place] that she underwent TEE and cardioversion at that time.  She stopped Eliquis due to inability to afford the medication and she refused to go on any other alternatives.  She has not had any documented arrhythmia since she has been following with [place].  She is doing well with no chest pain, shortness of breath or palpitations.  She went to the emergency room in June with vertigo but her symptoms have now resolved.  She has mildly tachycardic today but does not have symptoms.     Past Medical History:  Diagnosis Date  . Anxiety   . Blood transfusion without reported diagnosis   . Cataract   . Chest pain    a. Pt reports nl stress test in 2016 in [place]; b. 05/2017 declined stress test for recurrent c/p.  Marland Kitchen Hyperlipidemia   . Hypertension   . PAF (paroxysmal atrial fibrillation) (HCC)    a. Dx in 2017 in [place] per pt-->s/p TEE/DCCV;  b. CHA2DS2VASc = 4-->Eliquis.    Past Surgical History:  Procedure Laterality Date  . EYE SURGERY    . 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 kidney stones       Current Outpatient Medications  Medication Sig Dispense Refill  . doxepin (SINEQUAN) 10 MG capsule Take 1 capsule (10 mg total) by mouth at bedtime.  90 capsule 3  . meclizine (ANTIVERT) 25 MG tablet Take 1 tablet (25 mg total) by mouth 3 (three) times daily as needed for dizziness. 40 tablet 1  . Melatonin 10 MG TABS Take 10 mg by mouth at bedtime.    . Multiple Vitamin (MULTIVITAMIN) tablet Take 1 tablet by mouth daily.    Marland Kitchen oxybutynin (DITROPAN XL) 15 MG 24 hr tablet Take 1 tablet (15 mg total) by mouth at bedtime. 90 tablet 3   No current facility-administered medications for this visit.    Allergies:   Diltiazem and Latex    Social History:  The patient  reports that she has never smoked. She has never used smokeless tobacco. She reports that she does not drink alcohol and does not use drugs.   Family History:  The patient's family history includes Alzheimer's disease in her sister; Cancer in her brother and sister; Diabetes in her brother, mother, and sister.    ROS:  Please see the history of present illness.   Otherwise, review of systems are positive for none.   All other systems are reviewed and negative.    PHYSICAL EXAM: VS:  BP 140/80 (BP Location: Left Arm, Patient Position: Sitting, Cuff Size: Large)   Pulse (!) 107   Ht 5\' 1"  (1.549 m)   Wt 190 lb 2 oz (86.2 kg)  SpO2 98%   BMI 35.92 kg/m  , BMI Body mass index is 35.92 kg/m. GEN: Well nourished, well developed, in no acute distress  HEENT: normal  Neck: no JVD, carotid bruits, or masses Cardiac: RRR; no murmurs, rubs, or gallops,no edema  Respiratory:  clear to auscultation bilaterally, normal work of breathing GI: soft, nontender, nondistended, + BS MS: no deformity or atrophy  Skin: warm and dry, no rash Neuro:  Strength and sensation are intact Psych: euthymic mood, full affect   EKG:  EKG is ordered today. The ekg ordered today demonstrates sinus tachycardia with low voltage, inferior infarct with poor R wave progression in the anterior leads.   Recent Labs: 09/05/2019: ALT 16 12/12/2019: BUN 17; Creatinine, Ser 0.80; Hemoglobin 12.0; Platelets  235; Potassium 4.4; Sodium 140    Lipid Panel    Component Value Date/Time   CHOL 199 05/15/2017 0929   TRIG 101 05/15/2017 0929   HDL 58 05/15/2017 0929   LDLCALC 121 (H) 05/15/2017 0929      Wt Readings from Last 3 Encounters:  01/01/20 190 lb 2 oz (86.2 kg)  12/15/19 187 lb 9.6 oz (85.1 kg)  12/12/19 186 lb 15.2 oz (84.8 kg)       PAD Screen 04/19/2017  Previous PAD dx? No  Previous surgical procedure? No  Pain with walking? No  Feet/toe relief with dangling? No  Painful, non-healing ulcers? No  Extremities discolored? No      ASSESSMENT AND PLAN:  1.  Paroxysmal atrial fibrillation: This was diagnosed in [place] in 2017 in the setting of a 2-week hospitalization for pneumonia.  She says she underwent TEE and cardioversion at that time.  No recurrent arrhythmias so far and since then, the patient has declined anticoagulation.  Continue clinical monitoring for now.  2.  Essential hypertension: Blood pressure is controlled without medications.   Disposition:   FU with me in 12 months  Signed,  Lorine Bears, MD  01/01/2020 1:24 PM    [facility]

## 2020-01-01 NOTE — Patient Instructions (Addendum)
Medication Instructions:  No changes  *If you need a refill on your cardiac medications before your next appointment, please call your pharmacy*   Lab Work: None  If you have labs (blood work) drawn today and your tests are completely normal, you will receive your results only by: Marland Kitchen MyChart Message (if you have MyChart) OR . A paper copy in the mail If you have any lab test that is abnormal or we need to change your treatment, we will call you to review the results.   Testing/Procedures: None   Follow-Up: At [facility], you and your health needs are our priority.  As part of our continuing mission to provide you with exceptional heart care, we have created designated Provider Care Teams.  These Care Teams include your primary Cardiologist (physician) and Advanced Practice Providers (APPs -  Physician Assistants and Nurse Practitioners) who all work together to provide you with the care you need, when you need it.  We recommend signing up for the patient portal called "MyChart".  Sign up information is provided on this After Visit Summary.  MyChart is used to connect with patients for Virtual Visits (Telemedicine).  Patients are able to view lab/test results, encounter notes, upcoming appointments, etc.  Non-urgent messages can be sent to your provider as well.   To learn more about what you can do with MyChart, go to [email or web address].    Your next appointment:   12 month(s)  The format for your next appointment:   In Person  Provider:    You may see Lorine Bears, MD or one of the following Advanced Practice Providers on your designated Care Team:    Nicolasa Ducking, NP  Eula Listen, PA-C  Marisue Ivan, PA-C

## 2020-01-02 DIAGNOSIS — R42 Dizziness and giddiness: Secondary | ICD-10-CM | POA: Diagnosis not present

## 2020-01-28 ENCOUNTER — Encounter: Payer: Self-pay | Admitting: Nurse Practitioner

## 2020-01-28 ENCOUNTER — Other Ambulatory Visit: Payer: Self-pay

## 2020-01-28 ENCOUNTER — Ambulatory Visit (INDEPENDENT_AMBULATORY_CARE_PROVIDER_SITE_OTHER): Payer: Medicare HMO | Admitting: Nurse Practitioner

## 2020-01-28 VITALS — BP 138/76 | HR 92 | Temp 98.2°F | Wt 189.6 lb

## 2020-01-28 DIAGNOSIS — H81393 Other peripheral vertigo, bilateral: Secondary | ICD-10-CM

## 2020-01-28 DIAGNOSIS — L209 Atopic dermatitis, unspecified: Secondary | ICD-10-CM

## 2020-01-28 MED ORDER — DOXEPIN HCL 10 MG PO CAPS: 10.0000 mg | ORAL_CAPSULE | Freq: Every day | ORAL | 3 refills | Status: AC

## 2020-01-28 MED ORDER — PREDNISONE 10 MG PO TABS: ORAL_TABLET | ORAL | 0 refills | Status: DC

## 2020-01-28 NOTE — Assessment & Plan Note (Addendum)
Ongoing with no improvement.  Will switch to Triamcinolone cream to use BID and script for Prednisone sent.  She is scheduled to see dermatology in August per her report, she is to call provider and alert to date of appointment.  Recommend she take a daily Claritin or Allegra for pruritus and avoid scratching areas + use oatmeal baths.  Return to office in 6 months, sooner if worsening symptoms. OF NOTE: Spoke to her son, DPR, on phone to confirm patient is seeing dermatology. He reports he thinks she cancelled appointment on 02/06/20 -- recommended he check on this with patient and ensure she schedules to see them due to ongoing rash.

## 2020-01-28 NOTE — Assessment & Plan Note (Signed)
Ongoing, improved at this time.  Continue to collaborate with neurology as needed -- this will benefit both recent vertigo and memory changes.  Return to office in 6 months, sooner if worsening symptoms.

## 2020-01-28 NOTE — Patient Instructions (Signed)
Please let me know when you see skin doctor.  Start taking Prednisone taper as instructed on bottle for rash and continue creams.  Try taking a daily Claritin 10 MG by mouth to see if offers benefit to rash and itching.  Rash, Adult  A rash is a change in the color of your skin. A rash can also change the way your skin feels. There are many different conditions and factors that can cause a rash. Follow these instructions at home: The goal of treatment is to stop the itching and keep the rash from spreading. Watch for any changes in your symptoms. Let your doctor know about them. Follow these instructions to help with your condition: Medicine Take or apply over-the-counter and prescription medicines only as told by your doctor. These may include medicines:  To treat red or swollen skin (corticosteroid creams).  To treat itching.  To treat an allergy (oral antihistamines).  To treat very bad symptoms (oral corticosteroids).  Skin care  Put cool cloths (compresses) on the affected areas.  Do not scratch or rub your skin.  Avoid covering the rash. Make sure that the rash is exposed to air as much as possible. Managing itching and discomfort  Avoid hot showers or baths. These can make itching worse. A cold shower may help.  Try taking a bath with: ? Epsom salts. You can get these at your local pharmacy or grocery store. Follow the instructions on the package. ? Baking soda. Pour a small amount into the bath as told by your doctor. ? Colloidal oatmeal. You can get this at your local pharmacy or grocery store. Follow the instructions on the package.  Try putting baking soda paste onto your skin. Stir water into baking soda until it gets like a paste.  Try putting on a lotion that relieves itchiness (calamine lotion).  Keep cool and out of the sun. Sweating and being hot can make itching worse. General instructions   Rest as needed.  Drink enough fluid to keep your pee (urine)  pale yellow.  Wear loose-fitting clothing.  Avoid scented soaps, detergents, and perfumes. Use gentle soaps, detergents, perfumes, and other cosmetic products.  Avoid anything that causes your rash. Keep a journal to help track what causes your rash. Write down: ? What you eat. ? What cosmetic products you use. ? What you drink. ? What you wear. This includes jewelry.  Keep all follow-up visits as told by your doctor. This is important. Contact a doctor if:  You sweat at night.  You lose weight.  You pee (urinate) more than normal.  You pee less than normal, or you notice that your pee is a darker color than normal.  You feel weak.  You throw up (vomit).  Your skin or the whites of your eyes look yellow (jaundice).  Your skin: ? Tingles. ? Is numb.  Your rash: ? Does not go away after a few days. ? Gets worse.  You are: ? More thirsty than normal. ? More tired than normal.  You have: ? New symptoms. ? Pain in your belly (abdomen). ? A fever. ? Watery poop (diarrhea). Get help right away if:  You have a fever and your symptoms suddenly get worse.  You start to feel mixed up (confused).  You have a very bad headache or a stiff neck.  You have very bad joint pains or stiffness.  You have jerky movements that you cannot control (seizure).  Your rash covers all or most of  your body. The rash may or may not be painful.  You have blisters that: ? Are on top of the rash. ? Grow larger. ? Grow together. ? Are painful. ? Are inside your nose or mouth.  You have a rash that: ? Looks like purple pinprick-sized spots all over your body. ? Has a "bull's eye" or looks like a target. ? Is red and painful, causes your skin to peel, and is not from being in the sun too long. Summary  A rash is a change in the color of your skin. A rash can also change the way your skin feels.  The goal of treatment is to stop the itching and keep the rash from  spreading.  Take or apply over-the-counter and prescription medicines only as told by your doctor.  Contact a doctor if you have new symptoms or symptoms that get worse.  Keep all follow-up visits as told by your doctor. This is important. This information is not intended to replace advice given to you by your health care provider. Make sure you discuss any questions you have with your health care provider. Document Revised: 09/27/2018 Document Reviewed: 01/07/2018 Elsevier Patient Education  2020 [employer].

## 2020-01-28 NOTE — Progress Notes (Signed)
BP 138/76   Pulse 92   Temp 98.2 F (36.8 C) (Oral)   Wt 189 lb 9.6 oz (86 kg)   SpO2 97%   BMI 35.82 kg/m    Subjective:    Patient ID: Holly Lewis, female    DOB: Mar 29, 1933, 86 y.o.   MRN: [id number]  HPI: Holly Lewis is a 86 y.o. female  Chief Complaint  Patient presents with  . Rash    6 week f/up  . Dizziness    pt states she completed the meclizine that she was given    DIZZINESS Follow-up for vertigo today.  Seen in ER 12/10/19 and 12/12/19 for peripheral vertigo.  Chest x-ray on 12/10/19 showed no active cardiopulmonary disease and CT head noted mild cerebral atrophy and microvascular disease, but no acute intracranial abnormality.  Labs in hospital negative troponin, no anemia, and normal kidney function.  EKG at recent visit with ER noted NSR.    Seen by cardiology 01/01/20 with no changes and neurology on 01/02/20 -- she continued Meclizine 12.5MG  TID for 2 weeks, educated her on semont maneuver -- reported plan to send to ENT if ongoing symptoms.  At this time she reports no further dizzy episodes, states "they are gone".  Has been changing position slowly which has helped.   Duration: weeks Description of symptoms: ill-defined Duration of episode: seconds Dizziness frequency: intermittent and improved at this time Provoking factors: movement Aggravating factors:  movement Triggered by rolling over in bed: no Triggered by bending over: yes Aggravated by head movement: no Aggravated by exertion, coughing, loud noises: no Recent head injury: no Recent or current viral symptoms: no History of vasovagal episodes: no Nausea: no Vomiting: no Tinnitus: no Hearing loss: no Aural fullness: no Headache: no Photophobia/phonophobia: no Unsteady gait: no Postural instability: no Diplopia, dysarthria, dysphagia or weakness: no Related to exertion: no Pallor: no Diaphoresis: no Dyspnea: no Chest pain: no   RASH Was seen for this on 11/28/19 and given  prescription for Pimecrolimus 1% cream -- has been using this every day she reports.  Reports this is ongoing to forearms, legs, and neck.  Now has areas on legs.  She is going to see dermatology upcoming. Duration:  weeks  Location: generalized  Itching: yes Burning: no Redness: yes Oozing: no Scaling: no Blisters: no Painful: no Fevers: no Change in detergents/soaps/personal care products: no Recent illness: no Recent travel:no History of same: no Context: fluctuating Alleviating factors: nothing Treatments attempted: Pimecrolimus 1% cream  Shortness of breath: no  Throat/tongue swelling: no Myalgias/arthralgias: no   Relevant past medical, surgical, family and social history reviewed and updated as indicated. Interim medical history since our last visit reviewed. Allergies and medications reviewed and updated.  Review of Systems  Constitutional: Negative for activity change, appetite change, diaphoresis, fatigue and fever.  Respiratory: Negative for cough, chest tightness and shortness of breath.   Cardiovascular: Negative for chest pain, palpitations and leg swelling.  Gastrointestinal: Negative.   Musculoskeletal: Negative for arthralgias.  Skin: Positive for rash.  Neurological: Negative.   Psychiatric/Behavioral: Negative.     Per HPI unless specifically indicated above     Objective:    BP 138/76   Pulse 92   Temp 98.2 F (36.8 C) (Oral)   Wt 189 lb 9.6 oz (86 kg)   SpO2 97%   BMI 35.82 kg/m   Wt Readings from Last 3 Encounters:  01/28/20 189 lb 9.6 oz (86 kg)  01/01/20 190 lb 2 oz (86.2 kg)  12/15/19 187 lb 9.6 oz (85.1 kg)    Physical Exam Vitals and nursing note reviewed.  Constitutional:      General: She is awake. She is not in acute distress.    Appearance: She is well-developed and well-groomed. She is obese. She is not ill-appearing.  HENT:     Head: Normocephalic.     Right Ear: Hearing, tympanic membrane, ear canal and external ear  normal.     Left Ear: Hearing, tympanic membrane, ear canal and external ear normal.  Eyes:     General: Lids are normal.        Right eye: No discharge.        Left eye: No discharge.     Conjunctiva/sclera: Conjunctivae normal.     Pupils: Pupils are equal, round, and reactive to light.  Neck:     Thyroid: No thyromegaly.     Vascular: No carotid bruit.  Cardiovascular:     Rate and Rhythm: Normal rate and regular rhythm.     Heart sounds: Normal heart sounds. No murmur heard.  No gallop.   Pulmonary:     Effort: Pulmonary effort is normal. No accessory muscle usage or respiratory distress.     Breath sounds: Normal breath sounds.  Abdominal:     General: Bowel sounds are normal.     Palpations: Abdomen is soft.  Musculoskeletal:     Cervical back: Normal range of motion and neck supple.     Right lower leg: No edema.     Left lower leg: No edema.  Lymphadenopathy:     Cervical: No cervical adenopathy.  Skin:    General: Skin is warm and dry.     Findings: Rash present.     Comments: Splotchy areas of rash, flat, moderate erythema to bilateral forearms, anterior right side neck, and bilateral lower legs.  No vesicles.  Skin intact.  No scaling noted.  Neurological:     Mental Status: She is alert.     Cranial Nerves: Cranial nerves are intact.     Motor: Motor function is intact.     Coordination: Coordination is intact.     Gait: Gait is intact.     Deep Tendon Reflexes: Reflexes are normal and symmetric.     Reflex Scores:      Brachioradialis reflexes are 2+ on the right side and 2+ on the left side.      Patellar reflexes are 2+ on the right side and 2+ on the left side.    Comments: Oriented to year, month, day, and provider.  Uses cane to walk.  Unable to recreate dizziness on exam today.  Psychiatric:        Attention and Perception: Attention normal.        Mood and Affect: Mood normal.        Speech: Speech normal.        Behavior: Behavior normal. Behavior is  cooperative.        Thought Content: Thought content normal.     Results for orders placed or performed during the hospital encounter of 12/12/19  Basic metabolic panel  Result Value Ref Range   Sodium 140 135 - 145 mmol/L   Potassium 4.4 3.5 - 5.1 mmol/L   Chloride 106 98 - 111 mmol/L   CO2 25 22 - 32 mmol/L   Glucose, Bld 106 (H) 70 - 99 mg/dL   BUN 17 8 - 23 mg/dL   Creatinine, Ser 4.40 0.44 - 1.00 mg/dL  Calcium 9.2 8.9 - 10.3 mg/dL   GFR calc non Af Amer >60 >60 mL/min   GFR calc Af Amer >60 >60 mL/min   Anion gap 9 5 - 15  CBC with Differential  Result Value Ref Range   WBC 8.4 4.0 - 10.5 K/uL   RBC 3.97 3.87 - 5.11 MIL/uL   Hemoglobin 12.0 12.0 - 15.0 g/dL   HCT 40.9 (L) 36 - 46 %   MCV 90.2 80.0 - 100.0 fL   MCH 30.2 26.0 - 34.0 pg   MCHC 33.5 30.0 - 36.0 g/dL   RDW 73.5 32.9 - 92.4 %   Platelets 235 150 - 400 K/uL   nRBC 0.0 0.0 - 0.2 %   Neutrophils Relative % 64 %   Neutro Abs 5.4 1.7 - 7.7 K/uL   Lymphocytes Relative 22 %   Lymphs Abs 1.8 0.7 - 4.0 K/uL   Monocytes Relative 10 %   Monocytes Absolute 0.9 0 - 1 K/uL   Eosinophils Relative 3 %   Eosinophils Absolute 0.2 0 - 0 K/uL   Basophils Relative 1 %   Basophils Absolute 0.1 0 - 0 K/uL   Immature Granulocytes 0 %   Abs Immature Granulocytes 0.03 0.00 - 0.07 K/uL  Troponin I (High Sensitivity)  Result Value Ref Range   Troponin I (High Sensitivity) 6 <18 ng/L  Troponin I (High Sensitivity)  Result Value Ref Range   Troponin I (High Sensitivity) 8 <18 ng/L      Assessment & Plan:   Problem List Items Addressed This Visit      Nervous and Auditory   Peripheral vertigo of both ears - Primary    Ongoing, improved at this time.  Continue to collaborate with neurology as needed -- this will benefit both recent vertigo and memory changes.  Return to office in 6 months, sooner if worsening symptoms.          Musculoskeletal and Integument   Atopic dermatitis    Ongoing with no improvement.  Will  switch to Triamcinolone cream to use BID and script for Prednisone sent.  She is scheduled to see dermatology in August per her report, she is to call provider and alert to date of appointment.  Recommend she take a daily Claritin or Allegra for pruritus and avoid scratching areas + use oatmeal baths.  Return to office in 6 months, sooner if worsening symptoms. OF NOTE: Spoke to her son, DPR, on phone to confirm patient is seeing dermatology. He reports he thinks she cancelled appointment on 02/06/20 -- recommended he check on this with patient and ensure she schedules to see them due to ongoing rash.          Follow up plan: Return in about 6 months (around 07/30/2020) for HTN/HLD, Insomnia, Urge incontinence.

## 2020-02-10 ENCOUNTER — Ambulatory Visit: Payer: Medicare HMO | Admitting: Nurse Practitioner

## 2020-02-13 DIAGNOSIS — R42 Dizziness and giddiness: Secondary | ICD-10-CM | POA: Diagnosis not present

## 2020-03-04 ENCOUNTER — Other Ambulatory Visit: Payer: Self-pay | Admitting: Nurse Practitioner

## 2020-03-10 ENCOUNTER — Other Ambulatory Visit: Payer: Self-pay | Admitting: Nurse Practitioner

## 2020-03-10 ENCOUNTER — Telehealth: Payer: Self-pay | Admitting: Nurse Practitioner

## 2020-03-10 NOTE — Telephone Encounter (Signed)
PT need a refill  oxybutynin (DITROPAN XL) 15 MG 24 hr tablet [[id number]]  [id number] - [place], [place] - [address] AT [facility] OF [address] [facility]  [address] [place], [place] [address]  Phone:  [phone] Fax:  [phone]

## 2020-03-10 NOTE — Telephone Encounter (Signed)
Request already taken care of by other on 03/04/20. Refusing this request as not needed.

## 2020-03-10 NOTE — Telephone Encounter (Signed)
Return call to patient and spoke to her. Patient stated she already found out what this medication is for.

## 2020-03-10 NOTE — Telephone Encounter (Signed)
Copied from CRM [id number]. Topic: General - Other >> Mar 10, 2020  8:34 AM Lyn Hollingshead D wrote: PT need to speack with a nurse about this medicaton / she forgot what is for oxybutynin (DITROPAN XL) 15 MG 24 hr tablet

## 2020-04-12 DIAGNOSIS — H353132 Nonexudative age-related macular degeneration, bilateral, intermediate dry stage: Secondary | ICD-10-CM | POA: Diagnosis not present

## 2020-04-12 DIAGNOSIS — H3561 Retinal hemorrhage, right eye: Secondary | ICD-10-CM | POA: Diagnosis not present

## 2020-04-12 DIAGNOSIS — Z961 Presence of intraocular lens: Secondary | ICD-10-CM | POA: Diagnosis not present

## 2020-04-13 DIAGNOSIS — Z0100 Encounter for examination of eyes and vision without abnormal findings: Secondary | ICD-10-CM | POA: Diagnosis not present

## 2020-04-22 ENCOUNTER — Other Ambulatory Visit: Payer: Self-pay | Admitting: Nurse Practitioner

## 2020-04-22 ENCOUNTER — Telehealth: Payer: Self-pay

## 2020-04-22 MED ORDER — PIMECROLIMUS 1 % EX CREA: 1.0000 "application " | TOPICAL_CREAM | Freq: Two times a day (BID) | CUTANEOUS | 2 refills | Status: DC

## 2020-04-22 NOTE — Telephone Encounter (Signed)
I have sent in refill on her cream, not sure if she wants refill on anything else for this.  Please see if she has seen dermatology too, she was to see them months ago for this.  Thank you.

## 2020-04-22 NOTE — Telephone Encounter (Signed)
Please advise pt seen 8/24   Copied from CRM #[id number]. Topic: General - Other >> Apr 22, 2020  9:21 AM Lyn Hollingshead D wrote: Pt seen Jolene for some rashes on her skin before, they are back she requesting if the same medication can be prescribe an send to the pharmacy / please advise

## 2020-04-22 NOTE — Telephone Encounter (Signed)
Noted! Thank you

## 2020-04-22 NOTE — Telephone Encounter (Signed)
Called pt she said that she has everything else otc. Pt states that she is scheduled for dermatology next month with a promise that if they have a cancellation that they will call her

## 2020-05-21 DIAGNOSIS — L309 Dermatitis, unspecified: Secondary | ICD-10-CM | POA: Diagnosis not present

## 2020-05-21 DIAGNOSIS — L308 Other specified dermatitis: Secondary | ICD-10-CM | POA: Diagnosis not present

## 2020-05-27 ENCOUNTER — Ambulatory Visit: Payer: Self-pay

## 2020-05-27 NOTE — Telephone Encounter (Signed)
PA Darcey Nora PA called to speak with [facility] NP about patient.  Call dropped before being transferred. Attempted to contact back but was only able to leave VM to return call to office.

## 2020-05-27 NOTE — Telephone Encounter (Signed)
Noted, can you reach out to their office in morning to see if any concerns or questions for me.  Thank you.

## 2020-05-27 NOTE — Telephone Encounter (Signed)
fyi

## 2020-05-28 NOTE — Telephone Encounter (Signed)
Called and left a message asking if PA still needed to talk with Jolene and if so to return our call.

## 2020-05-31 NOTE — Telephone Encounter (Signed)
Will closes encounter since we have not got a return call.

## 2020-06-02 ENCOUNTER — Other Ambulatory Visit: Payer: Self-pay | Admitting: Nurse Practitioner

## 2020-06-02 ENCOUNTER — Telehealth: Payer: Self-pay

## 2020-06-02 MED ORDER — MIRABEGRON ER 25 MG PO TB24: 25.0000 mg | ORAL_TABLET | Freq: Every day | ORAL | 4 refills | Status: DC

## 2020-06-02 NOTE — Telephone Encounter (Signed)
Ray (pt's son) called to advise that he believes the medication his mother is taking for urine incontinence is causing her to have a severe rash. Caller states he has read on the Internet that side effects of the medications are rash and memory loss and that his mother is experiencing both of these issues. Caller would like his mother to be prescribed an alternative medication if possible and would welcome the opportunity to speak with Jolene if there are any concerns.  Caller can be reached at [phone].

## 2020-06-02 NOTE — Telephone Encounter (Signed)
Please advise 

## 2020-06-02 NOTE — Progress Notes (Signed)
Change from Ditropan to Myrbetriq due to advanced age and higher risk profile at her age with Ditropan + rash from Ditropan.

## 2020-06-02 NOTE — Telephone Encounter (Signed)
Spoke to patient son, Rosalia Hammers, on phone and discussed with him had been talking to patient at visits about medication change, but she had been hesitant due to her symptoms with incontinence.  He agrees with provider about change. Change from Ditropan to Myrbetriq due to advanced age and higher risk profile at her age with Ditropan + rash from Ditropan.  Myrbetriq has lower side effect profile for her age group.  Script sent in.  Discussed with her son that cost or coverage may be issue, but we will work on this.

## 2020-06-02 NOTE — Telephone Encounter (Signed)
Called Holly Lewis to get more information as to why he needs to speak with Holly Lewis, no answer, left vm  Copied from CRM #[id number]. Topic: General - Other >> Jun 02, 2020  9:16 AM Holly Lewis wrote: Reason for CRM: Pt son Holly Lewis requests that [employer] call him back. Cb# [phone]

## 2020-06-03 ENCOUNTER — Ambulatory Visit: Payer: Medicare HMO | Admitting: Nurse Practitioner

## 2020-06-04 DIAGNOSIS — L2084 Intrinsic (allergic) eczema: Secondary | ICD-10-CM | POA: Diagnosis not present

## 2020-07-15 ENCOUNTER — Telehealth: Payer: Self-pay

## 2020-07-15 NOTE — Telephone Encounter (Signed)
Copied from CRM [id number]. Topic: General - Call Back - No Documentation >> Jul 15, 2020  2:15 PM Randol Kern wrote: Pt called and is interested in transportation assistance, please advise Best contact: [phone]

## 2020-07-23 ENCOUNTER — Encounter: Payer: Self-pay | Admitting: Nurse Practitioner

## 2020-07-23 DIAGNOSIS — E669 Obesity, unspecified: Secondary | ICD-10-CM | POA: Insufficient documentation

## 2020-07-23 DIAGNOSIS — I700 Atherosclerosis of aorta: Secondary | ICD-10-CM | POA: Insufficient documentation

## 2020-07-27 ENCOUNTER — Telehealth: Payer: Self-pay

## 2020-07-27 NOTE — Telephone Encounter (Signed)
Copied from CRM [id number]. Topic: General - Other >> Jul 27, 2020  3:43 PM Mcneil, Ja-Kwan wrote: Reason for CRM: Pt stated she had a missed call from the office and she was wondering if the call was regarding transportation for her appt. Pt requests call back

## 2020-07-29 ENCOUNTER — Telehealth: Payer: Self-pay | Admitting: Nurse Practitioner

## 2020-07-29 ENCOUNTER — Ambulatory Visit: Payer: Self-pay | Admitting: Licensed Clinical Social Worker

## 2020-07-29 DIAGNOSIS — Z748 Other problems related to care provider dependency: Secondary | ICD-10-CM

## 2020-07-29 NOTE — Telephone Encounter (Signed)
   Telephone encounter was:  Successful.  07/29/2020 Name: [name] MRN: [id number] DOB: 1932-09-30  Holly Lewis is a 87 y.o. year old female who is a primary care patient of Cannady, Dorie Rank, NP . The community resource team was consulted for assistance with Transportation Needs   Care guide performed the following interventions: Investigation of community resources performed Discussed resources to assist with patient's son because I couldn't reach patient and this was an urgent referral for transportation tomorrow. Patient's son said that he didn't know that she had an appointment and that he will take her tomorrow at 8:40am to the appointment. He thinks his mother forgot to tell him that she had a doctors appointment and he will call her to remind her and let her know that he will take her in the morning. I gave him my information if he she refuses to be taken by him and promised to send him an email with transportation information through her insurance. There is no need for a transportation referral at this time. .  Follow Up Plan:  No further follow up planned at this time. The patient has been provided with needed resources.  Rojelio Brenner Care Guide, Embedded Care Coordination [facility], Care Management Phone: [phone] Email: [email or web address]

## 2020-07-29 NOTE — Chronic Care Management (AMB) (Signed)
  Care Management   Follow Up Note   07/29/2020 Name: Alaine Loughney MRN: [id number] DOB: 08/05/1932  Referred by: Marjie Skiff, NP Reason for referral : No chief complaint on file.   Kimesha Claxton is a 87 y.o. year old female who is a primary care patient of Cannady, Dorie Rank, NP. The care management team was consulted for assistance with care management and care coordination needs.    Review of patient status, including review of consultants reports, relevant laboratory and other test results, and collaboration with appropriate care team members and the patient's provider was performed as part of comprehensive patient evaluation and provision of chronic care management services.    CCM LCSW received update from front desk staff at [facility] that patient has not heard from [facility] yet and is still need transportation arrangements and assistance for appointment tomorrow. Urgent referral completed to [facility] today and in basket message staff message sent to C3 team.  Dickie La, BSW, MSW, LCSW [facility] Care Management [facility]  [facility] [email or web address] Phone: [phone]

## 2020-07-30 ENCOUNTER — Other Ambulatory Visit: Payer: Self-pay

## 2020-07-30 ENCOUNTER — Encounter: Payer: Self-pay | Admitting: Nurse Practitioner

## 2020-07-30 ENCOUNTER — Ambulatory Visit (INDEPENDENT_AMBULATORY_CARE_PROVIDER_SITE_OTHER): Payer: Medicare HMO | Admitting: Nurse Practitioner

## 2020-07-30 VITALS — BP 138/84 | HR 91 | Temp 98.5°F | Ht 59.65 in | Wt 187.6 lb

## 2020-07-30 DIAGNOSIS — N3941 Urge incontinence: Secondary | ICD-10-CM

## 2020-07-30 DIAGNOSIS — E7800 Pure hypercholesterolemia, unspecified: Secondary | ICD-10-CM | POA: Diagnosis not present

## 2020-07-30 DIAGNOSIS — R413 Other amnesia: Secondary | ICD-10-CM | POA: Diagnosis not present

## 2020-07-30 DIAGNOSIS — M8949 Other hypertrophic osteoarthropathy, multiple sites: Secondary | ICD-10-CM

## 2020-07-30 DIAGNOSIS — F5101 Primary insomnia: Secondary | ICD-10-CM

## 2020-07-30 DIAGNOSIS — I700 Atherosclerosis of aorta: Secondary | ICD-10-CM

## 2020-07-30 DIAGNOSIS — R69 Illness, unspecified: Secondary | ICD-10-CM | POA: Diagnosis not present

## 2020-07-30 DIAGNOSIS — I480 Paroxysmal atrial fibrillation: Secondary | ICD-10-CM

## 2020-07-30 DIAGNOSIS — I10 Essential (primary) hypertension: Secondary | ICD-10-CM | POA: Diagnosis not present

## 2020-07-30 DIAGNOSIS — M159 Polyosteoarthritis, unspecified: Secondary | ICD-10-CM

## 2020-07-30 NOTE — Assessment & Plan Note (Signed)
Ongoing with [facility] today, she is able to recall date/time/location/provider, but recall is affected -- unable to recall address provided and unable to correctly state months of year backwards.  Suspect some underlying dementia.  She does not wish to start medication due to history of diarrhea with Aricept.  Recommend she take daily Vitamin D supplement and B12.  Spoke to her son today and recommend he return to neurology with her.  Discussed option of imaging -- he will discuss with her.  Return in 6 months, sooner if worsening.  Will place CCM referral, unsure if she will allow this participation.

## 2020-07-30 NOTE — Assessment & Plan Note (Signed)
BMI 37.07 with HTN/HLD.  Recommended eating smaller high protein, low fat meals more frequently and exercising 30 mins a day 5 times a week with a goal of 10-15lb weight loss in the next 3 months. Patient voiced their understanding and motivation to adhere to these recommendations.

## 2020-07-30 NOTE — Assessment & Plan Note (Signed)
Chronic, ongoing.  She had been on Oxybutynin for several years and recently attempted change to Mybetriq due to her advanced age and less risk factors with this regimen. She reports side effects with this change and stopped taking.  Taking nothing at this time, her son took Oyxbutynin away after discussion with PCP about risks with her age.  At this time continues with symptoms, will place referral to urology for further recommendations that would be safe for her age group.  Return in 6 months.

## 2020-07-30 NOTE — Assessment & Plan Note (Signed)
Chronic, stable.  Continue current medication regimen and adjust as needed.  Reports good sleep pattern.

## 2020-07-30 NOTE — Progress Notes (Signed)
BP 138/84 (BP Location: Left Arm)   Pulse 91   Temp 98.5 F (36.9 C) (Oral)   Ht 4' 11.65" (1.515 m)   Wt 187 lb 9.6 oz (85.1 kg)   SpO2 94%   BMI 37.07 kg/m    Subjective:    Patient ID: Holly Lewis, female    DOB: 05-04-1933, 87 y.o.   MRN: [id number]  HPI: Holly Lewis is a 87 y.o. female  Chief Complaint  Patient presents with  . Hypertension  . Hyperlipidemia   HYPERTENSION / HYPERLIPIDEMIA/A-FIB No current medications, refuses these. Saw cardiology on 01/01/2020 for her paroxysmal atrial fibrillation, she has declined anticoagulation or treatments.Seen recently in office for memory changes, did not want to start Aricept.  She did see neurology on 02/13/20 last due to episodes vertigo, which resolved. Satisfied with current treatment?yes Duration of hypertension:chronic BP monitoring frequency:daily BP range:130/70's at home BP medication side effects:no Duration of hyperlipidemia:chronic Aspirin:no Recent stressors:no Recurrent headaches:no Visual changes:no Palpitations:no Dyspnea:no Chest pain:no Lower extremity edema:no Dizzy/lightheaded:no 6[facility] Screen 07/30/2020 07/29/2018 07/25/2017  What Year? 0 points 0 points 0 points  What month? 0 points 0 points 0 points  What time? 0 points 0 points 0 points  Count back from 20 0 points 0 points 0 points  Months in reverse 4 points 0 points 0 points  Repeat phrase 8 points 2 points 0 points  Total Score 12 2 0   OSTEOARTHRITIS: Reports Meloxicam helps when needed (usually once a week), at baseline has aches and pains here and there. Takes Tylenol occasionally.  Her arthritis ranges from hips to knees, plus occasionally other places. Does endorse being on the move often and working around house. Uses heat at times. Has not been using creams. Does not wish to go to physical therapy.   URGE INCONTINENCE: Was switched to Mybetriq 25 MG on 06/02/20 due to her age -- previously on Ditropan.    She reports having issues after taking Myrbetriq 1st time, but she can not recall what.  Is not taking anything at all at the moment.  Reports continues to get up to bathroom at least 4 times a night. Does endorse when she gets up to bathroom she drinks a glass of water. At baseline drinks a lot of water during daytime. Denies UTI symptoms at this time. No polyuria, polydipsia, polyphagia.   INSOMNIA Continues on Doxepin and Melatonin for insomnia with no ADR. States she sleeps very well. Duration: chronic Satisfied with sleep quality: yes Difficulty falling asleep: no Difficulty staying asleep: no Waking a few hours after sleep onset: no Early morning awakenings: no Daytime hypersomnolence: no Wakes feeling refreshed: no Good sleep hygiene: yes Apnea: no Snoring: no Depressed/anxious mood: no Recent stress: no Restless legs/nocturnal leg cramps: no Chronic pain/arthritis: yes History of sleep study: no Treatments attempted: melatonin   Relevant past medical, surgical, family and social history reviewed and updated as indicated. Interim medical history since our last visit reviewed. Allergies and medications reviewed and updated.  Review of Systems  Constitutional: Negative for activity change, appetite change, diaphoresis, fatigue and fever.  Respiratory: Negative for cough, chest tightness and shortness of breath.   Cardiovascular: Negative for chest pain, palpitations and leg swelling.  Gastrointestinal: Negative.   Musculoskeletal: Negative for arthralgias.  Skin: Negative for rash.  Neurological: Negative.   Psychiatric/Behavioral: Negative.     Per HPI unless specifically indicated above     Objective:    BP 138/84 (BP Location: Left Arm)   Pulse  91   Temp 98.5 F (36.9 C) (Oral)   Ht 4' 11.65" (1.515 m)   Wt 187 lb 9.6 oz (85.1 kg)   SpO2 94%   BMI 37.07 kg/m   Wt Readings from Last 3 Encounters:  07/30/20 187 lb 9.6 oz (85.1 kg)  01/28/20 189 lb 9.6  oz (86 kg)  01/01/20 190 lb 2 oz (86.2 kg)    Physical Exam Vitals and nursing note reviewed.  Constitutional:      General: She is awake. She is not in acute distress.    Appearance: She is well-developed and well-groomed. She is obese. She is not ill-appearing.  HENT:     Head: Normocephalic.     Right Ear: Hearing normal.     Left Ear: Hearing normal.  Eyes:     General: Lids are normal.        Right eye: No discharge.        Left eye: No discharge.     Conjunctiva/sclera: Conjunctivae normal.     Pupils: Pupils are equal, round, and reactive to light.  Neck:     Thyroid: No thyromegaly.     Vascular: No carotid bruit.  Cardiovascular:     Rate and Rhythm: Normal rate and regular rhythm.     Heart sounds: Normal heart sounds. No murmur heard. No gallop.   Pulmonary:     Effort: Pulmonary effort is normal. No accessory muscle usage or respiratory distress.     Breath sounds: Normal breath sounds.  Abdominal:     General: Bowel sounds are normal.     Palpations: Abdomen is soft.  Musculoskeletal:     Cervical back: Normal range of motion and neck supple.     Right lower leg: Edema (trace) present.     Left lower leg: Edema (trace) present.  Lymphadenopathy:     Cervical: No cervical adenopathy.  Skin:    General: Skin is warm and dry.  Neurological:     Mental Status: She is alert and oriented to person, place, and time.  Psychiatric:        Attention and Perception: Attention normal.        Mood and Affect: Mood normal.        Speech: Speech normal.        Behavior: Behavior normal. Behavior is cooperative.        Thought Content: Thought content normal.    Results for orders placed or performed during the hospital encounter of 12/12/19  Basic metabolic panel  Result Value Ref Range   Sodium 140 135 - 145 mmol/L   Potassium 4.4 3.5 - 5.1 mmol/L   Chloride 106 98 - 111 mmol/L   CO2 25 22 - 32 mmol/L   Glucose, Bld 106 (H) 70 - 99 mg/dL   BUN 17 8 - 23 mg/dL    Creatinine, Ser 3.71 0.44 - 1.00 mg/dL   Calcium 9.2 8.9 - 69.6 mg/dL   GFR calc non Af Amer >60 >60 mL/min   GFR calc Af Amer >60 >60 mL/min   Anion gap 9 5 - 15  CBC with Differential  Result Value Ref Range   WBC 8.4 4.0 - 10.5 K/uL   RBC 3.97 3.87 - 5.11 MIL/uL   Hemoglobin 12.0 12.0 - 15.0 g/dL   HCT 78.9 (L) 38.1 - 01.7 %   MCV 90.2 80.0 - 100.0 fL   MCH 30.2 26.0 - 34.0 pg   MCHC 33.5 30.0 - 36.0 g/dL  RDW 13.4 11.5 - 15.5 %   Platelets 235 150 - 400 K/uL   nRBC 0.0 0.0 - 0.2 %   Neutrophils Relative % 64 %   Neutro Abs 5.4 1.7 - 7.7 K/uL   Lymphocytes Relative 22 %   Lymphs Abs 1.8 0.7 - 4.0 K/uL   Monocytes Relative 10 %   Monocytes Absolute 0.9 0.1 - 1.0 K/uL   Eosinophils Relative 3 %   Eosinophils Absolute 0.2 0.0 - 0.5 K/uL   Basophils Relative 1 %   Basophils Absolute 0.1 0.0 - 0.1 K/uL   Immature Granulocytes 0 %   Abs Immature Granulocytes 0.03 0.00 - 0.07 K/uL  Troponin I (High Sensitivity)  Result Value Ref Range   Troponin I (High Sensitivity) 6 <18 ng/L  Troponin I (High Sensitivity)  Result Value Ref Range   Troponin I (High Sensitivity) 8 <18 ng/L      Assessment & Plan:   Problem List Items Addressed This Visit      Cardiovascular and Mediastinum   Essential hypertension, benign    Chronic, well controlled without medications, BP at goal in office on rechecl and home for her age.  She declines treatment today, appropriate based on her goals of care.  Continue to collaborate with cardiology. Recommend she monitor BP at least a few mornings a week at home and document.  DASH diet at home.  Labs today.  Return in 6 months.       Relevant Orders   Basic metabolic panel   TSH   PAF (paroxysmal atrial fibrillation) (HCC) - Primary    Chronic, well controlled without medications.  She declines treatments or procedures.  Continue to collaborate with cardiology.  Will obtain BMP and TSH today.      Aortic atherosclerosis (HCC)    Noted on past  renal CT 09/06/19, she refuses statin or ASA therapy.        Musculoskeletal and Integument   Osteoarthritis    Chronic, stable.  Refuses PT.  Continue simple treatment at home and adjust as needed.          Other   Hypercholesteremia    Chronic, ongoing.  Refuses statin and refuses lipid panel today, this is appropriate based on age.  Continue to monitor.      Insomnia    Chronic, stable.  Continue current medication regimen and adjust as needed.  Reports good sleep pattern.      Urge incontinence of urine    Chronic, ongoing.  She had been on Oxybutynin for several years and recently attempted change to Mybetriq due to her advanced age and less risk factors with this regimen. She reports side effects with this change and stopped taking.  Taking nothing at this time, her son took Oyxbutynin away after discussion with PCP about risks with her age.  At this time continues with symptoms, will place referral to urology for further recommendations that would be safe for her age group.  Return in 6 months.      Relevant Orders   Ambulatory referral to Urology   Memory impairment    Ongoing with 6CIT 12 today, she is able to recall date/time/location/provider, but recall is affected -- unable to recall address provided and unable to correctly state months of year backwards.  Suspect some underlying dementia.  She does not wish to start medication due to history of diarrhea with Aricept.  Recommend she take daily Vitamin D supplement and B12.  Spoke to her son today  and recommend he return to neurology with her.  Discussed option of imaging -- he will discuss with her.  Return in 6 months, sooner if worsening.  Will place CCM referral, unsure if she will allow this participation.        Morbid obesity (HCC)    BMI 37.07 with HTN/HLD.  Recommended eating smaller high protein, low fat meals more frequently and exercising 30 mins a day 5 times a week with a goal of 10-15lb weight loss in the next  3 months. Patient voiced their understanding and motivation to adhere to these recommendations.           Follow up plan: Return in about 6 months (around 01/27/2021) for HTN, A-Fib, Insomnia, urge incontinence.

## 2020-07-30 NOTE — Assessment & Plan Note (Signed)
Chronic, well controlled without medications.  She declines treatments or procedures.  Continue to collaborate with cardiology.  Will obtain BMP and TSH today.

## 2020-07-30 NOTE — Assessment & Plan Note (Signed)
Chronic, ongoing.  Refuses statin and refuses lipid panel today, this is appropriate based on age.  Continue to monitor.

## 2020-07-30 NOTE — Assessment & Plan Note (Signed)
Noted on past renal CT 09/06/19, she refuses statin or ASA therapy.

## 2020-07-30 NOTE — Assessment & Plan Note (Signed)
Chronic, well controlled without medications, BP at goal in office on rechecl and home for her age.  She declines treatment today, appropriate based on her goals of care.  Continue to collaborate with cardiology. Recommend she monitor BP at least a few mornings a week at home and document.  DASH diet at home.  Labs today.  Return in 6 months.

## 2020-07-30 NOTE — Assessment & Plan Note (Signed)
Chronic, stable.  Refuses PT.  Continue simple treatment at home and adjust as needed.

## 2020-07-30 NOTE — Patient Instructions (Signed)

## 2020-07-31 LAB — BASIC METABOLIC PANEL
BUN/Creatinine Ratio: 25 (ref 12–28)
BUN: 26 mg/dL (ref 8–27)
CO2: 18 mmol/L — ABNORMAL LOW (ref 20–29)
Calcium: 9.5 mg/dL (ref 8.7–10.3)
Chloride: 103 mmol/L (ref 96–106)
Creatinine, Ser: 1.04 mg/dL — ABNORMAL HIGH (ref 0.57–1.00)
GFR calc Af Amer: 57 mL/min/{1.73_m2} — ABNORMAL LOW (ref >59)
GFR calc non Af Amer: 49 mL/min/{1.73_m2} — ABNORMAL LOW (ref >59)
Glucose: 109 mg/dL — ABNORMAL HIGH (ref 65–99)
Potassium: 4.3 mmol/L (ref 3.5–5.2)
Sodium: 141 mmol/L (ref 134–144)

## 2020-07-31 LAB — TSH: TSH: 2.89 u[IU]/mL (ref 0.450–4.500)

## 2020-07-31 NOTE — Progress Notes (Signed)
Good morning, please let Holly Lewis know her labs have returned.  She does show some Stage 3, mild, kidney disease. We will continue to monitor this at visits, it is something that she could live with until she is 89 and goal is for it not to worsen.  Drink lots of water at home.  Thyroid level is normal.  If any questions please let me know.  Have a great day!! Keep being awesome!!  Thank you for allowing me to participate in your care. Kindest regards, Hasten Sweitzer

## 2020-08-11 ENCOUNTER — Ambulatory Visit: Payer: Medicare HMO

## 2020-08-13 ENCOUNTER — Ambulatory Visit: Payer: Medicare HMO

## 2020-08-18 DIAGNOSIS — R42 Dizziness and giddiness: Secondary | ICD-10-CM | POA: Diagnosis not present

## 2020-08-27 DIAGNOSIS — G4700 Insomnia, unspecified: Secondary | ICD-10-CM | POA: Diagnosis not present

## 2020-08-27 DIAGNOSIS — R413 Other amnesia: Secondary | ICD-10-CM | POA: Diagnosis not present

## 2020-08-27 DIAGNOSIS — Z8249 Family history of ischemic heart disease and other diseases of the circulatory system: Secondary | ICD-10-CM | POA: Diagnosis not present

## 2020-08-27 DIAGNOSIS — M1990 Unspecified osteoarthritis, unspecified site: Secondary | ICD-10-CM | POA: Diagnosis not present

## 2020-08-27 DIAGNOSIS — Z008 Encounter for other general examination: Secondary | ICD-10-CM | POA: Diagnosis not present

## 2020-08-27 DIAGNOSIS — I480 Paroxysmal atrial fibrillation: Secondary | ICD-10-CM | POA: Diagnosis not present

## 2020-08-27 DIAGNOSIS — E785 Hyperlipidemia, unspecified: Secondary | ICD-10-CM | POA: Diagnosis not present

## 2020-08-27 DIAGNOSIS — Z6837 Body mass index (BMI) 37.0-37.9, adult: Secondary | ICD-10-CM | POA: Diagnosis not present

## 2020-08-27 DIAGNOSIS — N3941 Urge incontinence: Secondary | ICD-10-CM | POA: Diagnosis not present

## 2020-08-27 DIAGNOSIS — I10 Essential (primary) hypertension: Secondary | ICD-10-CM | POA: Diagnosis not present

## 2020-09-03 ENCOUNTER — Ambulatory Visit: Payer: Medicare HMO

## 2020-09-03 ENCOUNTER — Telehealth: Payer: Self-pay

## 2020-09-03 NOTE — Telephone Encounter (Signed)
This nurse attempted to call patient three times. At 1340, 1345, and 1355. Message states no vioce mailbox has been set up. Unable to leave a message.

## 2020-09-06 ENCOUNTER — Ambulatory Visit: Payer: Medicare HMO | Admitting: Urology

## 2020-09-06 ENCOUNTER — Other Ambulatory Visit: Payer: Self-pay

## 2020-09-06 VITALS — BP 155/85 | HR 96 | Ht 61.0 in | Wt 180.0 lb

## 2020-09-06 DIAGNOSIS — R32 Unspecified urinary incontinence: Secondary | ICD-10-CM | POA: Diagnosis not present

## 2020-09-06 DIAGNOSIS — N3946 Mixed incontinence: Secondary | ICD-10-CM

## 2020-09-06 LAB — BLADDER SCAN AMB NON-IMAGING: Scan Result: 0

## 2020-09-06 MED ORDER — OXYBUTYNIN CHLORIDE ER 10 MG PO TB24: 10.0000 mg | ORAL_TABLET | Freq: Every day | ORAL | 11 refills | Status: DC

## 2020-09-06 NOTE — Patient Instructions (Signed)
Cystoscopy Cystoscopy is a procedure that is used to help diagnose and sometimes treat conditions that affect the lower urinary tract. The lower urinary tract includes the bladder and the urethra. The urethra is the tube that drains urine from the bladder. Cystoscopy is done using a thin, tube-shaped instrument with a light and camera at the end (cystoscope). The cystoscope may be hard or flexible, depending on the goal of the procedure. The cystoscope is inserted through the urethra, into the bladder. Cystoscopy may be recommended if you have:  Urinary tract infections that keep coming back.  Blood in the urine (hematuria).  An inability to control when you urinate (urinary incontinence) or an overactive bladder.  Unusual cells found in a urine sample.  A blockage in the urethra, such as a urinary stone.  Painful urination.  An abnormality in the bladder found during an intravenous pyelogram (IVP) or CT scan. Cystoscopy may also be done to remove a sample of tissue to be examined under a microscope (biopsy). What are the risks? Generally, this is a safe procedure. However, problems may occur, including:  Infection.  Bleeding.  What happens during the procedure?  1. You will be given one or more of the following: ? A medicine to numb the area (local anesthetic). 2. The area around the opening of your urethra will be cleaned. 3. The cystoscope will be passed through your urethra into your bladder. 4. Germ-free (sterile) fluid will flow through the cystoscope to fill your bladder. The fluid will stretch your bladder so that your health care provider can clearly examine your bladder walls. 5. Your doctor will look at the urethra and bladder. 6. The cystoscope will be removed The procedure may vary among health care providers  What can I expect after the procedure? After the procedure, it is common to have: 1. Some soreness or pain in your abdomen and urethra. 2. Urinary symptoms.  These include: ? Mild pain or burning when you urinate. Pain should stop within a few minutes after you urinate. This may last for up to 1 week. ? A small amount of blood in your urine for several days. ? Feeling like you need to urinate but producing only a small amount of urine. Follow these instructions at home: General instructions  Return to your normal activities as told by your health care provider.   Do not drive for 24 hours if you were given a sedative during your procedure.  Watch for any blood in your urine. If the amount of blood in your urine increases, call your health care provider.  If a tissue sample was removed for testing (biopsy) during your procedure, it is up to you to get your test results. Ask your health care provider, or the department that is doing the test, when your results will be ready.  Drink enough fluid to keep your urine pale yellow.  Keep all follow-up visits as told by your health care provider. This is important. Contact a health care provider if you:  Have pain that gets worse or does not get better with medicine, especially pain when you urinate.  Have trouble urinating.  Have more blood in your urine. Get help right away if you:  Have blood clots in your urine.  Have abdominal pain.  Have a fever or chills.  Are unable to urinate. Summary  Cystoscopy is a procedure that is used to help diagnose and sometimes treat conditions that affect the lower urinary tract.  Cystoscopy is done using   a thin, tube-shaped instrument with a light and camera at the end.  After the procedure, it is common to have some soreness or pain in your abdomen and urethra.  Watch for any blood in your urine. If the amount of blood in your urine increases, call your health care provider.  If you were prescribed an antibiotic medicine, take it as told by your health care provider. Do not stop taking the antibiotic even if you start to feel better. This  information is not intended to replace advice given to you by your health care provider. Make sure you discuss any questions you have with your health care provider. Document Revised: 05/28/2018 Document Reviewed: 05/28/2018 Elsevier Patient Education  2020 [employer]   

## 2020-09-06 NOTE — Progress Notes (Signed)
09/06/2020 1:16 PM   Holly Lewis 11-02-1932 [id number]  Referring provider: Marjie Skiff, NP [address] [place],  [place] [address]  Chief Complaint  Patient presents with   Urinary Incontinence    HPI: I was consulted to assess the patient's urinary continence.  She leaks with coughing sneezing.  She has urge incontinence.  She wears 5 or 6 pads a day moderately wet.  She wears multiple pads a night for moderately severe bedwetting.  He says when she coughs she leaks a small amount  She voids every 2-3 hours and gets up 4 times a night.  She may have had a rash from Myrbetriq.  She has had a kidney stone.  She uses a cane for balance  No history of infections hysterectomy or neurologic issues.  Bowel movements normal.   PMH: Past Medical History:  Diagnosis Date   Anxiety    Blood transfusion without reported diagnosis    Cataract    Chest pain    a. Pt reports nl stress test in 2016 in [place]; b. 05/2017 declined stress test for recurrent c/p.   Hyperlipidemia    Hypertension    PAF (paroxysmal atrial fibrillation) (HCC)    a. Dx in 2017 in [place] per pt-->s/p TEE/DCCV;  b. CHA2DS2VASc = 4-->Eliquis.    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 If you have any questions, ask your nurse or doctor.        Apoaequorin 10 MG Caps Take by mouth.   donepezil 5 MG tablet Commonly known as: ARICEPT Take 5 mg by mouth daily.   doxepin 10 MG capsule Commonly known as: SINEQUAN Take 1 capsule (10 mg total) by mouth at bedtime.   Melatonin 10 MG Tabs Take 10 mg by mouth at bedtime.   meloxicam 15 MG tablet Commonly known as: MOBIC Take 7.5 mg by  mouth daily as needed.   multivitamin tablet Take 1 tablet by mouth daily.   pimecrolimus 1 % cream Commonly known as: ELIDEL Apply 1 application topically 2 (two) times daily.   triamcinolone 0.1 % Commonly known as: KENALOG Apply 1 application topically 2 (two) times daily.       Allergies:  Allergies  Allergen Reactions   Diltiazem Rash   Latex Rash    Family History: Family History  Problem Relation Age of Onset   Diabetes Mother    Diabetes Sister    Cancer Sister        breast   Alzheimer's disease Sister    Diabetes Brother    Cancer Brother        blood    Social History:  reports that she has never smoked. She has never used smokeless tobacco. She reports that she does not drink alcohol and does not use drugs.  ROS:  Physical Exam: There were no vitals taken for this visit.  Constitutional:  Alert and oriented, No acute distress. HEENT: [place] AT, moist mucus membranes.  Trachea midline, no masses. Cardiovascular: No clubbing, cyanosis, or edema. Respiratory: Normal respiratory effort, no increased work of breathing. GI: Abdomen is soft, nontender, nondistended, no abdominal masses GU: No CVA tenderness.  Narrow introitus with limited access.  May have leaked a few drops with a good cough.  Well supported bladder neck Skin: No rashes, bruises or suspicious lesions. Lymph: No cervical or inguinal adenopathy. Neurologic: Grossly intact, no focal deficits, moving all 4 extremities. Psychiatric: Normal mood and affect.  Laboratory Data: Lab Results  Component Value Date   WBC 8.4 12/12/2019   HGB 12.0 12/12/2019   HCT 35.8 (L) 12/12/2019   MCV 90.2 12/12/2019   PLT 235 12/12/2019    Lab Results  Component Value Date   CREATININE 1.04 (H) 07/30/2020    No results found for: PSA  No results found for: TESTOSTERONE  No results found for: HGBA1C  Urinalysis    Component  Value Date/Time   COLORURINE YELLOW (A) 09/05/2019 2018   APPEARANCEUR CLEAR (A) 09/05/2019 2018   LABSPEC 1.014 09/05/2019 2018   PHURINE 6.0 09/05/2019 2018   GLUCOSEU NEGATIVE 09/05/2019 2018   HGBUR NEGATIVE 09/05/2019 2018   BILIRUBINUR NEGATIVE 09/05/2019 2018   KETONESUR NEGATIVE 09/05/2019 2018   PROTEINUR NEGATIVE 09/05/2019 2018   NITRITE NEGATIVE 09/05/2019 2018   LEUKOCYTESUR NEGATIVE 09/05/2019 2018    Pertinent Imaging: Urine reviewed.  Normal postvoid residual.  Urine sent for culture.  Chart reviewed  Assessment & Plan: Patient has high-volume mixed incontinence and high-volume bedwetting.  She is 87 years of age so I thought it was best to hold off on the urodynamics.  I like to perform cystoscopy in about 5 weeks on oxybutynin ER 10 mg.  Percutaneous tibial nerve stimulation and perhaps Botox or other options for her.  Call if urine culture is positive.  Bulking agent is a distant treatment and I think clinically the patient primarily has an overactive bladder  1. Urinary incontinence, unspecified type  - Urinalysis, Complete - CULTURE, URINE COMPREHENSIVE   No follow-ups on file.  Martina Sinner, MD  [facility] [address], [address] [place], [place] [address] [phone]

## 2020-09-06 NOTE — Addendum Note (Signed)
Addended by: Milas Kocher A on: 09/06/2020 01:45 PM   Modules accepted: Orders

## 2020-09-07 ENCOUNTER — Telehealth: Payer: Self-pay

## 2020-09-07 ENCOUNTER — Other Ambulatory Visit: Payer: Self-pay | Admitting: Nurse Practitioner

## 2020-09-07 LAB — URINALYSIS, COMPLETE
Bilirubin, UA: NEGATIVE
Glucose, UA: NEGATIVE
Ketones, UA: NEGATIVE
Nitrite, UA: NEGATIVE
Protein,UA: NEGATIVE
RBC, UA: NEGATIVE
Specific Gravity, UA: 1.015 (ref 1.005–1.030)
Urobilinogen, Ur: 0.2 mg/dL (ref 0.2–1.0)
pH, UA: 6 (ref 5.0–7.5)

## 2020-09-07 LAB — MICROSCOPIC EXAMINATION

## 2020-09-07 MED ORDER — MIRABEGRON ER 25 MG PO TB24: 25.0000 mg | ORAL_TABLET | Freq: Every day | ORAL | 4 refills | Status: DC

## 2020-09-07 NOTE — Telephone Encounter (Signed)
Copied from CRM [id number]. Topic: General - Other >> Sep 07, 2020  8:31 AM Gwenlyn Fudge wrote: Reason for CRM: Pt called and is requesting to speak with PCP regarding the medication she was prescribed by urologist, oxybutynin. She states that she stopped taking this medication due to the rash it gave her.  She states that she would prefer to have the mybetriq that she was prescribed by PCP a while back. Please advise  Would pt need a apt for this?Or can medication be sent?

## 2020-09-07 NOTE — Telephone Encounter (Signed)
Patient notified

## 2020-09-07 NOTE — Telephone Encounter (Signed)
Will send in Mybetriq, would prefer she take this as well due to her age.  If any issues with this change I recommend she reach out to me and alert me to issues.  Thank you.

## 2020-09-07 NOTE — Telephone Encounter (Signed)
Please advise 

## 2020-09-10 LAB — CULTURE, URINE COMPREHENSIVE

## 2020-09-13 ENCOUNTER — Telehealth: Payer: Self-pay

## 2020-09-13 MED ORDER — CIPROFLOXACIN HCL 250 MG PO TABS: 250.0000 mg | ORAL_TABLET | Freq: Two times a day (BID) | ORAL | 0 refills | Status: AC

## 2020-09-13 NOTE — Telephone Encounter (Signed)
Pt aware of medication being sent to pharmacy and verbalized understanding.

## 2020-09-13 NOTE — Telephone Encounter (Signed)
-----   Message from Levada Schilling, [place] sent at 09/13/2020  7:40 AM EDT -----  ----- Message ----- From: Alfredo Martinez, MD Sent: 09/12/2020   3:16 PM EDT To: Levada Schilling, CMA   Ciprofloxacin 250 mg twice a day for 7 days  ----- Message ----- From: Levada Schilling, CMA Sent: 09/09/2020  11:46 AM EDT To: Alfredo Martinez, MD   ----- Message ----- From: Interface, [facility] Lab Results In Sent: 09/07/2020   1:37 PM EDT To: Jennette Kettle Clinical

## 2020-09-21 ENCOUNTER — Telehealth: Payer: Self-pay

## 2020-09-21 NOTE — Telephone Encounter (Signed)
At this time I would continue Mybetriq, as safer medication choice for her age as well.  Oxybutynin is on allergy list and would not fill this.  Will monitor and Destiny can you call over to urology office to alert her that medication will not be started, had history of rash with this in past.  Family wish to continue Myrbetriq.

## 2020-09-21 NOTE — Telephone Encounter (Signed)
Should I advised patient's son to contact Urology as they were the one to prescribe patient the prescription for Oxybutynin. Please advise.

## 2020-09-21 NOTE — Telephone Encounter (Signed)
Copied from CRM [id number]. Topic: General - Other >> Sep 21, 2020 10:23 AM Gaetana Michaelis A wrote: Reason for CRM: Patient's son has made contact regarding patient's prescription from their urologist  Patient has been prescribed oxybutynin but has a history of rashes related to the medication  Patient's son would like to discuss further with PCP or a member of clinical staff before he picks up the prescription for patient   Please contact to advise further

## 2020-09-21 NOTE — Telephone Encounter (Signed)
Patient's son states he was calling to just Dr.Jolene know that he is not sure if there is a discussion that needs to be had between the providers, as it took them a while for him to whine his mother off of the prescription for Oxybutynin as patient has a history of rashes, so he has not filled the recent prescription for patient and patient has just been taking her prescription for Myrbetriq.

## 2020-09-21 NOTE — Telephone Encounter (Signed)
Yes, would recommend he reach out to urology on this.

## 2020-09-21 NOTE — Telephone Encounter (Signed)
Left detailed message for [facility] clinical staff informing them to letting them know that patient will not starting prescription of Oxybutynin as the medication causes allergic reactions for patient as listed in her chart. Patient's son Rosalia Hammers was notified that Dr.Jolene recommends patient to continue Myrbetriq as it is safer for patient's age as well. Ray verbalized understanding and has no further questions.

## 2020-09-21 NOTE — Telephone Encounter (Signed)
Incoming call from patient's PCP's office stating that the patient will not be starting RX for oxybutynin as she is allergic to the medication. Instead will continue to take Myrbetriq. See additional telephone encounter from 09/21/20.

## 2020-09-23 NOTE — Telephone Encounter (Signed)
Referral completed by Rojelio Brenner on 07/29/20 Seen telephone encounter

## 2020-10-18 ENCOUNTER — Other Ambulatory Visit: Payer: Self-pay | Admitting: Urology

## 2021-01-23 ENCOUNTER — Encounter: Payer: Self-pay | Admitting: Nurse Practitioner

## 2021-01-23 DIAGNOSIS — N183 Chronic kidney disease, stage 3 unspecified: Secondary | ICD-10-CM | POA: Insufficient documentation

## 2021-01-27 ENCOUNTER — Ambulatory Visit: Payer: Medicare Other | Admitting: Nurse Practitioner

## 2021-03-12 ENCOUNTER — Ambulatory Visit (INDEPENDENT_AMBULATORY_CARE_PROVIDER_SITE_OTHER): Payer: Medicare Other | Admitting: *Deleted

## 2021-03-12 ENCOUNTER — Other Ambulatory Visit: Payer: Self-pay

## 2021-03-12 DIAGNOSIS — Z0000 Encounter for general adult medical examination without abnormal findings: Secondary | ICD-10-CM

## 2021-03-12 NOTE — Progress Notes (Signed)
Subjective:   Holly Lewis is a 87 y.o. female who presents for Medicare Annual (Subsequent) preventive examination.  I connected with  Holly Lewis on 03/12/21 by a audio enabled telemedicine application and verified that I am speaking with the correct person using two identifiers.   I discussed the limitations of evaluation and management by telemedicine. The patient expressed understanding and agreed to proceed.     Location of patient: Home Location of staff: Office  People attending visit: Holly Lewis; Holly Lewis, RMA Objective:    Today's Vitals   03/12/21 0924  PainSc: 0-No pain   There is no height or weight on file to calculate BMI. Visit completed by Telephone.  Advanced Directives 03/12/2021 12/10/2019 09/05/2019 08/11/2019 07/29/2018 05/23/2018 07/25/2017  Does Patient Have a Medical Advance Directive? No No Yes No No Yes Yes  Type of Advance Directive - - - - - Living will;Healthcare Power of [employer] of [place];Living will  Does patient want to make changes to medical advance directive? - - - - - No - Patient declined -  Copy of Healthcare Power of Attorney in Chart? - - - - - No - copy requested Yes  Would patient like information on creating a medical advance directive? No - Patient declined - - - No - Patient declined - -    Current Medications (verified) Outpatient Encounter Medications as of 03/12/2021  Medication Sig   Apoaequorin 10 MG CAPS Take by mouth.   donepezil (ARICEPT) 5 MG tablet Take 5 mg by mouth daily.   doxepin (SINEQUAN) 10 MG capsule Take 1 capsule (10 mg total) by mouth at bedtime.   Melatonin 10 MG TABS Take 10 mg by mouth at bedtime.   meloxicam (MOBIC) 15 MG tablet Take 7.5 mg by mouth daily as needed.   mirabegron ER (MYRBETRIQ) 25 MG TB24 tablet Take 1 tablet (25 mg total) by mouth daily.   Multiple Vitamin (MULTIVITAMIN) tablet Take 1 tablet by mouth daily.   No facility-administered encounter medications on  file as of 03/12/2021.    Allergies (verified) Diltiazem, Latex, and Oxybutynin   History: Past Medical History:  Diagnosis Date   Anxiety    Blood transfusion without reported diagnosis    Cataract    Chest pain    a. Pt reports nl stress test in 2016 in [place]; b. 05/2017 declined stress test for recurrent c/p.   Hyperlipidemia    Hypertension    PAF (paroxysmal atrial fibrillation) (HCC)    a. Dx in 2017 in [place] per pt-->s/p TEE/DCCV;  b. CHA2DS2VASc = 4-->Eliquis.   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   kidney stones     Family History  Problem Relation Age of Onset   Diabetes Mother    Diabetes Sister    Cancer Sister        breast   Alzheimer's disease Sister    Diabetes Brother    Cancer Brother        blood   Social History   Socioeconomic History   Marital status: Widowed    Spouse name: Not on file   Number of children: 3   Years of education: 16   Highest education level: Bachelor's degree (e.g., BA, AB, BS)  Occupational History   Occupation: retired  Tobacco Use   Smoking status: Never   Smokeless  tobacco: Never  Vaping Use   Vaping Use: Never used  Substance and Sexual Activity   Alcohol use: No   Drug use: No   Sexual activity: Never  Other Topics Concern   Not on file  Social History Narrative   Not on file   Social Determinants of Health   Financial Resource Strain: Low Risk    Difficulty of Paying Living Expenses: Not hard at all  Food Insecurity: No Food Insecurity   Worried About Programme researcher, broadcasting/film/video in the Last Year: Never true   Ran Out of Food in the Last Year: Never true  Transportation Needs: No Transportation Needs   Lack of Transportation (Medical): No   Lack of Transportation (Non-Medical): No  Physical Activity: Sufficiently Active   Days of Exercise per Week: 7 days   Minutes of Exercise per Session: 40 min  Stress:  No Stress Concern Present   Feeling of Stress : Not at all  Social Connections: Socially Isolated   Frequency of Communication with Friends and Family: More than three times a week   Frequency of Social Gatherings with Friends and Family: Three times a week   Attends Religious Services: Never   Active Member of Clubs or Organizations: No   Attends Banker Meetings: Never   Marital Status: Widowed    Tobacco Counseling Counseling given: No   Clinical Intake:  Pain : 0-10 Pain Score: 0-No pain   How often do you need to have someone help you when you read instructions, pamphlets, or other written materials from your doctor or pharmacy?: 1 - Never  Diabetic?No  Activities of Daily Living No flowsheet data found.  Patient Care Team: Holly Skiff, NP as PCP - General (Nurse Practitioner) Holly Ouch, MD as PCP - Cardiology (Cardiology) Holly Ouch, MD as Consulting Physician (Cardiology)  Indicate any recent Medical Services you may have received from other than [facility] providers in the past year (date may be approximate).     Assessment:   This is a routine wellness examination for Holly Lewis.  Hearing/Vision screen No results found. Patient states its been within a year or so since she had her hearing and vision checked. [facility] and [facility] and vaccines.   Dietary issues and exercise activities discussed: Current Exercise Habits: Home exercise routine (Yard Work and through Daily routine), Type of exercise: walking (gardeningand walking around cleaning yard), Time (Minutes): 45, Frequency (Times/Week): 7, Weekly Exercise (Minutes/Week): 315, Intensity: Moderate, Exercise limited by: None identified   Goals Addressed   None   Depression Screen PHQ 2/9 Scores 03/12/2021 03/12/2021 07/30/2020 08/11/2019 07/29/2018 11/14/2017 07/25/2017  PHQ - 2 Score 1 1 0 0 1 0 0  PHQ- 9 Score 1 1 - - - 3 -    Fall Risk Fall  Risk  03/12/2021 07/30/2020 09/18/2019 08/11/2019 07/29/2018  Falls in the past year? 0 0 0 0 0  Number falls in past yr: 0 - - 0 0  Injury with Fall? 0 - - 0 0  Risk for fall due to : Impaired balance/gait Impaired balance/gait - - -  Follow up Falls evaluation completed - - - -    FALL RISK PREVENTION PERTAINING TO THE HOME:  Any stairs in or around the home? No  If so, are there any without handrails? No  Home free of loose throw rugs in walkways, pet beds, electrical cords, etc? Yes  Adequate lighting in your  home to reduce risk of falls? Yes   ASSISTIVE DEVICES UTILIZED TO PREVENT FALLS:  Life alert? No  Use of a cane, walker or w/c? Yes   Grab bars in the bathroom? Yes  Shower chair or bench in shower? Yes  Elevated toilet seat or a handicapped toilet? Yes    Cognitive Function: MMSE - Mini Mental State Exam 11/28/2019  Orientation to time 4  Orientation to Place 4  Registration 3  Attention/ Calculation 5  Recall 2  Language- name 2 objects 2  Language- repeat 1  Language- follow 3 step command 2  Language- read & follow direction 1  Write a sentence 1  Copy design 1  Total score 26     6CIT Screen 03/12/2021 07/30/2020 07/29/2018 07/25/2017  What Year? 0 points 0 points 0 points 0 points  What month? 0 points 0 points 0 points 0 points  What time? 0 points 0 points 0 points 0 points  Count back from 20 0 points 0 points 0 points 0 points  Months in reverse 0 points 4 points 0 points 0 points  Repeat phrase 0 points 8 points 2 points 0 points  Total Score 0 12 2 0    Immunizations Immunization History  Administered Date(s) Administered   Fluad Quad(high Dose 65+) 11/11/2020   Influenza, High Dose Seasonal PF 04/02/2017, 05/01/2018   PFIZER Comirnaty(Gray Top)Covid-19 Tri-Sucrose Vaccine 01/07/2021   PFIZER(Purple Top)SARS-COV-2 Vaccination 09/29/2019, 10/21/2019, 06/25/2020, 06/30/2020   Pneumococcal Conjugate-13 11/18/2014   Pneumococcal Polysaccharide-23  06/19/2006   Tdap 01/07/2021    TDAP status: Up to date. Per pt [facility] in care everywhere, patient received vaccine on 01/07/2021  Flu Vaccine status: Due, Education has been provided regarding the importance of this vaccine. Advised may receive this vaccine at local pharmacy or Health Dept. Aware to provide a copy of the vaccination record if obtained from local pharmacy or Health Dept. Verbalized acceptance and understanding.  Pneumococcal vaccine status: Up to date. Per pt notes from [place] via Care everywhere, patient received vaccine on 11/18/2014 and 06/19/2006  Covid-19 vaccine status: Completed vaccines. Per pt [facility] notes, patient received vaccine on 01/07/2021  Qualifies for Shingles Vaccine? Yes   Zostavax completed No   Shingrix Completed?: No.    Education has been provided regarding the importance of this vaccine. Patient has been advised to call insurance company to determine out of pocket expense if they have not yet received this vaccine. Advised may also receive vaccine at local pharmacy or Health Dept. Verbalized acceptance and understanding.  Screening Tests Health Maintenance  Topic Date Due   Zoster Vaccines- Shingrix (1 of 2) Never done   INFLUENZA VACCINE  01/17/2021   TETANUS/TDAP  01/08/2031   DEXA SCAN  Completed   COVID-19 Vaccine  Completed   HPV VACCINES  Aged Out    Health Maintenance  Health Maintenance Due  Topic Date Due   Zoster Vaccines- Shingrix (1 of 2) Never done   INFLUENZA VACCINE  01/17/2021    Colorectal cancer screening: No longer required. No longer required due to age. Patient informed to reach out to [place] doctor to verify information and she agreed and verbalized understanding.   Mammogram Status: Patient do not qualify for mammogram due to age. Patient informed to call [facility] and verify information and see if she still qualifies for this screening.   Bone Density status: Completed 07/01/2015  Lung Cancer Screening: (Low Dose CT  Chest recommended if Age 23-80 years, 30 pack-year  currently smoking OR have quit w/in 15years.) does not qualify.    Additional Screening:  Hepatitis C Screening: does not qualify. Patient states she don't think she completed this but will ask her doctor at the [place].   Vision Screening: Recommended annual ophthalmology exams for early detection of glaucoma and other disorders of the eye. Is the patient up to date with their annual eye exam?  No  Who is the provider or what is the name of the office in which the patient attends annual eye exams? [facility] If pt is not established with a provider, would they like to be referred to a provider to establish care? No .   Dental Screening: Recommended annual dental exams for proper oral hygiene  Community Resource Referral / Chronic Care Management: CRR required this visit?  No   CCM required this visit?  No      Plan:     I have personally reviewed and noted the following in the patient's chart:   Medical and social history Use of alcohol, tobacco or illicit drugs  Current medications and supplements including opioid prescriptions.  Functional ability and status Nutritional status Physical activity Advanced directives List of other physicians Hospitalizations, surgeries, and ER visits in previous 12 months Vitals Screenings to include cognitive, depression, and falls Referrals and appointments  In addition, I have reviewed and discussed with patient certain preventive protocols, quality metrics, and best practice recommendations. A written personalized care plan for preventive services as well as general preventive health recommendations were provided to patient.     Holly Lewis [place], [place]   03/12/2021   Nurse Notes: None Face to Face,  Patient appt was completed by telephone. Patient verbalized understanding with everything being informed and asked during entire visit. Patient states she lives by herself and does  things for herself. Per pt her son do help her if she needs help. Per pt she goes out in her yard and stay busy all the time. Per pt she use a cane because of her feeling unstable from time.    Patient was not accompanied by anyone. Patient informed staff that her son may have set up her Advance Directive but she don't remember. Patient informed staff that she goes to the [facility] hospital and they take care of all of the screenings and vaccines for her. Patient informed staff due to staff asking patient if overdue vaccines and screening had been completed. Staff informed patient to please reach out to [facility] doctor and ask them if she's needing any screening and vaccines that have not been completed as dicussed with her. Patient verbalized understanding and agreed to do so.   Patient states she'll be going to the [facility] on Monday and she will find out if she completed what she's needing.

## 2021-03-25 ENCOUNTER — Encounter: Payer: Self-pay | Admitting: General Surgery

## 2021-08-08 ENCOUNTER — Other Ambulatory Visit: Payer: Self-pay

## 2021-08-08 ENCOUNTER — Emergency Department
Admission: EM | Admit: 2021-08-08 | Discharge: 2021-08-08 | Disposition: A | Discharge status: Home / Self Care | Payer: Medicare PPO | Attending: Emergency Medicine | Admitting: Emergency Medicine

## 2021-08-08 DIAGNOSIS — N390 Urinary tract infection, site not specified: Secondary | ICD-10-CM

## 2021-08-08 DIAGNOSIS — R399 Unspecified symptoms and signs involving the genitourinary system: Secondary | ICD-10-CM | POA: Diagnosis not present

## 2021-08-08 DIAGNOSIS — R3981 Functional urinary incontinence: Secondary | ICD-10-CM | POA: Diagnosis not present

## 2021-08-08 DIAGNOSIS — Z20822 Contact with and (suspected) exposure to covid-19: Secondary | ICD-10-CM | POA: Insufficient documentation

## 2021-08-08 DIAGNOSIS — R531 Weakness: Secondary | ICD-10-CM | POA: Diagnosis not present

## 2021-08-08 LAB — CBC
HCT: 36.8 % (ref 36.0–46.0)
Hemoglobin: 11.8 g/dL — ABNORMAL LOW (ref 12.0–15.0)
MCH: 29.7 pg (ref 26.0–34.0)
MCHC: 32.1 g/dL (ref 30.0–36.0)
MCV: 92.7 fL (ref 80.0–100.0)
Platelets: 269 10*3/uL (ref 150–400)
RBC: 3.97 MIL/uL (ref 3.87–5.11)
RDW: 13.3 % (ref 11.5–15.5)
WBC: 6.6 10*3/uL (ref 4.0–10.5)
nRBC: 0 % (ref 0.0–0.2)

## 2021-08-08 LAB — RESP PANEL BY RT-PCR (FLU A&B, COVID) ARPGX2
Influenza A by PCR: NEGATIVE
Influenza B by PCR: NEGATIVE
SARS Coronavirus 2 by RT PCR: NEGATIVE

## 2021-08-08 LAB — BASIC METABOLIC PANEL
Anion gap: 7 (ref 5–15)
BUN: 21 mg/dL (ref 8–23)
CO2: 25 mmol/L (ref 22–32)
Calcium: 9.1 mg/dL (ref 8.9–10.3)
Chloride: 105 mmol/L (ref 98–111)
Creatinine, Ser: 0.91 mg/dL (ref 0.44–1.00)
GFR, Estimated: >60 mL/min (ref >60)
Glucose, Bld: 147 mg/dL — ABNORMAL HIGH (ref 70–99)
Potassium: 3.9 mmol/L (ref 3.5–5.1)
Sodium: 137 mmol/L (ref 135–145)

## 2021-08-08 LAB — URINALYSIS, ROUTINE W REFLEX MICROSCOPIC
Bilirubin Urine: NEGATIVE
Glucose, UA: NEGATIVE mg/dL
Hgb urine dipstick: NEGATIVE
Ketones, ur: NEGATIVE mg/dL
Nitrite: NEGATIVE
Protein, ur: NEGATIVE mg/dL
Specific Gravity, Urine: 1.01 (ref 1.005–1.030)
pH: 7 (ref 5.0–8.0)

## 2021-08-08 LAB — TROPONIN I (HIGH SENSITIVITY): Troponin I (High Sensitivity): 7 ng/L (ref <18)

## 2021-08-08 MED ORDER — CEFTRIAXONE SODIUM 1 G IJ SOLR
1.0000 g | Freq: Once | INTRAMUSCULAR | Status: AC
  Administered 2021-08-08: 1 g via INTRAVENOUS
  Filled 2021-08-08: qty 10

## 2021-08-08 MED ORDER — SODIUM CHLORIDE 0.9 % IV BOLUS
500.0000 mL | Freq: Once | INTRAVENOUS | Status: AC
  Administered 2021-08-08: 500 mL via INTRAVENOUS

## 2021-08-08 MED ORDER — CEPHALEXIN 500 MG PO CAPS
500.0000 mg | ORAL_CAPSULE | Freq: Four times a day (QID) | ORAL | 0 refills | Status: AC
Start: 2021-08-08 — End: 2021-08-18

## 2021-08-08 NOTE — ED Notes (Signed)
Report to Adam, RN

## 2021-08-08 NOTE — ED Notes (Signed)
Pt presents to ED with c.o of "having a weird feeling". Pt states she has been feeling more weak than normal, pt denies this weakness is unilateral and is "all over". Pt states some dizziness but denies any HX of vertigo. Pt is A&Ox4. Pt states she has been having to increase the use of her cane from baseline due to weakness. Pt states this weakness started yesterday. Pt denies chest pain or SOB. Pt denies feeling sick recently. Pt denies N/V/D, pt denies fevers or chills.   Son at bedside.

## 2021-08-08 NOTE — Discharge Instructions (Signed)
Please seek medical attention for any high fevers, chest pain, shortness of breath, change in behavior, persistent vomiting, bloody stool or any other new or concerning symptoms. We have also given you a resource for mental health issues. Please give them a call as well, mental and physical health are closely intertwined.

## 2021-08-08 NOTE — ED Triage Notes (Signed)
Pt here with weakness. Pt had flu like symptoms for 2 weeks. Pt's husband states that pt says " she just feels bad" but cannot specify what is wrong with her. Pt crying in triage.

## 2021-08-08 NOTE — ED Notes (Signed)
Pt provided discharge instructions and prescription information. Pt was given the opportunity to ask questions and questions were answered. Discharge signature not obtained in the setting of the COVID-19 pandemic in order to reduce high touch surfaces.  ° °

## 2021-08-08 NOTE — ED Provider Notes (Signed)
[facility] Provider Note    Event Date/Time   First MD Initiated Contact with Patient 08/08/21 1000     (approximate)   History   Weakness   HPI  Niylah Hassan is a 88 y.o. female  who, per  office visit note dated 04/15/2021 has history of urinary incontinence, presents to the emergency department today because of concerns for weakness and feeling bad.  Patient does have a hard time explaining exactly what she means by that.  She does state that she has not been feeling well for weeks although has gotten worse acutely.  She denies any pain with this.  Does have a general sense of weakness.  Patient denies any fevers or shortness of breath.  Denies any urinary symptoms.   Physical Exam   Triage Vital Signs: ED Triage Vitals [08/08/21 0943]  Enc Vitals Group     BP (!) 168/103     Pulse Rate (!) 111     Resp 18     Temp 98 F (36.7 C)     Temp Source Oral     SpO2 93 %     Weight 179 lb 14.3 oz (81.6 kg)     Height 5\' 1"  (1.549 m)     Head Circumference      Peak Flow      Pain Score 0     Pain Loc      Pain Edu?      Excl. in GC?     Most recent vital signs: Vitals:   08/08/21 0943 08/08/21 0950  BP: (!) 168/103 (!) 127/111  Pulse: (!) 111 100  Resp: 18 (!) 22  Temp: 98 F (36.7 C)   SpO2: 93% 97%    General: Awake, no distress.  CV:  Good peripheral perfusion.  Resp:  Normal effort.  Abd:  No distention.     ED Results / Procedures / Treatments   Labs (all labs ordered are listed, but only abnormal results are displayed) Labs Reviewed  BASIC METABOLIC PANEL - Abnormal; Notable for the following components:      Result Value   Glucose, Bld 147 (*)    All other components within normal limits  CBC - Abnormal; Notable for the following components:   Hemoglobin 11.8 (*)    All other components within normal limits  URINALYSIS, ROUTINE W REFLEX MICROSCOPIC - Abnormal; Notable for the following components:   Color, Urine  YELLOW (*)    APPearance HAZY (*)    Leukocytes,Ua SMALL (*)    Bacteria, UA MANY (*)    All other components within normal limits  RESP PANEL BY RT-PCR (FLU A&B, COVID) ARPGX2  URINE CULTURE  CBG MONITORING, ED  TROPONIN I (HIGH SENSITIVITY)     EKG  I, 08/10/21, attending physician, personally viewed and interpreted this EKG  EKG Time: 0944 Rate: 109 Rhythm: sinus tachycardia Axis: left axis deviation Intervals: qtc 447 QRS: narrow, q waves III, aVF, V3-V6 ST changes: no st elevation Impression: abnormal ekg   RADIOLOGY None   PROCEDURES:  Critical Care performed: No  Procedures   MEDICATIONS ORDERED IN ED: Medications - No data to display   IMPRESSION / MDM / ASSESSMENT AND PLAN / ED COURSE  I reviewed the triage vital signs and the nursing notes.                              Differential  diagnosis includes, but is not limited to, anemia, electrolyte abnormality, infection.  Patient presented to the emergency department today because of concerns for feeling bad which seems to be mostly weakness.  Has been going on for weeks but worse over the past couple of days.  No focal findings on exam.  Patient's blood work without any significant anemia.  No concerning electrolyte abnormalities.  Urine was concerning for possible urinary tract infection.  When I went to discuss this finding with the patient she will became quite tearful.  She also states she had a lot of mental issues going on.  She denied any thoughts of self-harm.  I did offer to have psychiatry come talk to her today however patient declined.  I did discuss that mental health issues can affect her physical wellbeing as well.  Encourage patient to follow-up with mental health resources.  Patient was given dose of IV antibiotics here in the emergency department for the UTI and will be discharged with further antibiotics.  FINAL CLINICAL IMPRESSION(S) / ED DIAGNOSES   Final diagnoses:  Weakness   Lower urinary tract infectious disease     Rx / DC Orders   ED Discharge Orders          Ordered    cephALEXin (KEFLEX) 500 MG capsule  4 times daily        08/08/21 1402             Note:  This document was prepared using Dragon voice recognition software and may include unintentional dictation errors.    Phineas Semen, MD 08/08/21 [id number]

## 2021-08-10 ENCOUNTER — Other Ambulatory Visit: Payer: Self-pay

## 2021-08-10 ENCOUNTER — Encounter: Payer: Self-pay | Admitting: Nurse Practitioner

## 2021-08-10 ENCOUNTER — Ambulatory Visit (INDEPENDENT_AMBULATORY_CARE_PROVIDER_SITE_OTHER): Payer: Medicare PPO | Admitting: Nurse Practitioner

## 2021-08-10 VITALS — BP 128/70 | HR 88 | Temp 98.0°F | Ht 61.0 in | Wt 178.0 lb

## 2021-08-10 DIAGNOSIS — R413 Other amnesia: Secondary | ICD-10-CM

## 2021-08-10 DIAGNOSIS — Z6833 Body mass index (BMI) 33.0-33.9, adult: Secondary | ICD-10-CM

## 2021-08-10 DIAGNOSIS — N3001 Acute cystitis with hematuria: Secondary | ICD-10-CM

## 2021-08-10 DIAGNOSIS — E6609 Other obesity due to excess calories: Secondary | ICD-10-CM | POA: Diagnosis not present

## 2021-08-10 DIAGNOSIS — N390 Urinary tract infection, site not specified: Secondary | ICD-10-CM | POA: Insufficient documentation

## 2021-08-10 NOTE — Assessment & Plan Note (Signed)
Acute and improving symptoms at this time.  Recommend they continue Keflex as ordered until complete and then can recheck urine outpatient in upcoming weeks.  If any worsening symptoms, then return to office.

## 2021-08-10 NOTE — Assessment & Plan Note (Signed)
Being followed at [facility] at this time, which is appropriate, is a Actor.

## 2021-08-10 NOTE — Patient Instructions (Signed)

## 2021-08-10 NOTE — Progress Notes (Signed)
BP 128/70 (BP Location: Left Arm, Patient Position: Sitting, Cuff Size: Large)    Pulse 88    Temp 98 F (36.7 C) (Oral)    Ht 5\' 1"  (1.549 m)    Wt 178 lb (80.7 kg)    SpO2 95%    BMI 33.63 kg/m    Subjective:    Patient ID: Holly Lewis, female    DOB: Sep 24, 1932, 88 y.o.   MRN: [id number]  HPI: Holly Lewis is a 88 y.o. female  Chief Complaint  Patient presents with   Extremity Weakness   Urinary Tract Infection   Presents with her son Holly Lewis, who assists with HPI.  [facility] -- was diagnosed with TIAs and memory changes.    ER FOLLOW UP Seen in ER on 08/08/21 for weakness and feeling bad, her son took her to ER -- she told her son she "wished she could die".  Was diagnosed with UTI in ER and Keflex started, urine showing >100, 000.  At this time she reports feeling much better.  No further weakness.  Had been feeling bad for 2 1/2 weeks, originally thought she had flu (nausea and diarrhea), then a week later seemed okay.   Time since discharge: 08/08/21 Hospital/facility: [facility] Diagnosis: weakness Procedures/tests: urine and labs Consultants: none New medications: Keflex Discharge instructions:  follow-up with PCP Status: better   URINARY SYMPTOMS Currently treated with Keflex. Has underlying issues with urination at baseline and last saw urology 09/06/20. Dysuria: no Urinary frequency: no Urgency: yes at baseline Small volume voids: no Symptom severity: no Urinary incontinence: no Foul odor: no Hematuria: no Abdominal pain: no Back pain: no Suprapubic pain/pressure: no Flank pain: no Fever:  none Vomiting: no Status: better Previous urinary tract infection: yes Recurrent urinary tract infection: no Sexual activity: No sexually active History of sexually transmitted disease: no Treatments attempted: antibiotics and increasing fluids   Relevant past medical, surgical, family and social history reviewed and updated as indicated. Interim  medical history since our last visit reviewed. Allergies and medications reviewed and updated.  Review of Systems  Per HPI unless specifically indicated above     Objective:    BP 128/70 (BP Location: Left Arm, Patient Position: Sitting, Cuff Size: Large)    Pulse 88    Temp 98 F (36.7 C) (Oral)    Ht 5\' 1"  (1.549 m)    Wt 178 lb (80.7 kg)    SpO2 95%    BMI 33.63 kg/m   Wt Readings from Last 3 Encounters:  08/10/21 178 lb (80.7 kg)  08/08/21 179 lb 14.3 oz (81.6 kg)  09/06/20 180 lb (81.6 kg)    Physical Exam Vitals and nursing note reviewed.  Constitutional:      General: She is awake.     Appearance: She is well-developed.  HENT:     Head: Normocephalic.     Right Ear: Hearing normal.     Left Ear: Hearing normal.     Nose: Nose normal.     Mouth/Throat:     Mouth: Mucous membranes are moist.  Eyes:     General: Lids are normal.        Right eye: No discharge.        Left eye: No discharge.     Conjunctiva/sclera: Conjunctivae normal.     Pupils: Pupils are equal, round, and reactive to light.  Neck:     Thyroid: No thyromegaly.     Vascular: No carotid  bruit or JVD.  Cardiovascular:     Rate and Rhythm: Normal rate and regular rhythm.     Heart sounds: Normal heart sounds. No murmur heard.   No gallop.  Pulmonary:     Effort: Pulmonary effort is normal.     Breath sounds: Normal breath sounds.  Abdominal:     General: Bowel sounds are normal.     Palpations: Abdomen is soft. There is no hepatomegaly or splenomegaly.  Musculoskeletal:     Cervical back: Normal range of motion and neck supple.     Right lower leg: No edema.     Left lower leg: No edema.  Lymphadenopathy:     Cervical: No cervical adenopathy.  Skin:    General: Skin is warm and dry.  Neurological:     Mental Status: She is alert.     Comments: Pleasantly confused, stating year at 2 -- identified month as February, but day of week incorrect as Monday and Software engineer as Transport planner.   Psychiatric:        Attention and Perception: Attention normal.        Mood and Affect: Mood normal.        Behavior: Behavior normal. Behavior is cooperative.        Thought Content: Thought content normal.        Judgment: Judgment normal.    Results for orders placed or performed during the hospital encounter of 08/08/21  Resp Panel by RT-PCR (Flu A&B, Covid) Urine, Clean Catch   Specimen: Urine, Clean Catch; Nasopharyngeal(NP) swabs in vial transport medium  Result Value Ref Range   SARS Coronavirus 2 by RT PCR NEGATIVE NEGATIVE   Influenza A by PCR NEGATIVE NEGATIVE   Influenza B by PCR NEGATIVE NEGATIVE  Urine Culture   Specimen: Urine, Random  Result Value Ref Range   Specimen Description      URINE, RANDOM Performed at [facility], [address], [place],  [address]    Special Requests      NONE Performed at [facility], [address] [place],  [address]    Culture (A)     >=100,000 COLONIES/mL GRAM NEGATIVE RODS IDENTIFICATION AND SUSCEPTIBILITIES TO FOLLOW Performed at [facility] [address] [place], [place] [address]    Report Status PENDING   Basic metabolic panel  Result Value Ref Range   Sodium 137 135 - 145 mmol/L   Potassium 3.9 3.5 - 5.1 mmol/L   Chloride 105 98 - 111 mmol/L   CO2 25 22 - 32 mmol/L   Glucose, Bld 147 (H) 70 - 99 mg/dL   BUN 21 8 - 23 mg/dL   Creatinine, Ser 0.91 0.44 - 1.00 mg/dL   Calcium 9.1 8.9 - 10.3 mg/dL   GFR, Estimated >60 >60 mL/min   Anion gap 7 5 - 15  CBC  Result Value Ref Range   WBC 6.6 4.0 - 10.5 K/uL   RBC 3.97 3.87 - 5.11 MIL/uL   Hemoglobin 11.8 (L) 12.0 - 15.0 g/dL   HCT 36.8 36.0 - 46.0 %   MCV 92.7 80.0 - 100.0 fL   MCH 29.7 26.0 - 34.0 pg   MCHC 32.1 30.0 - 36.0 g/dL   RDW 13.3 11.5 - 15.5 %   Platelets 269 150 - 400 K/uL   nRBC 0.0 0.0 - 0.2 %  Urinalysis, Routine w reflex microscopic Urine, Clean Catch  Result Value Ref Range   Color, Urine YELLOW  (A) YELLOW  APPearance HAZY (A) CLEAR   Specific Gravity, Urine 1.010 1.005 - 1.030   pH 7.0 5.0 - 8.0   Glucose, UA NEGATIVE NEGATIVE mg/dL   Hgb urine dipstick NEGATIVE NEGATIVE   Bilirubin Urine NEGATIVE NEGATIVE   Ketones, ur NEGATIVE NEGATIVE mg/dL   Protein, ur NEGATIVE NEGATIVE mg/dL   Nitrite NEGATIVE NEGATIVE   Leukocytes,Ua SMALL (A) NEGATIVE   RBC / HPF 0-5 0 - 5 RBC/hpf   WBC, UA 11-20 0 - 5 WBC/hpf   Bacteria, UA MANY (A) NONE SEEN   Squamous Epithelial / LPF 0-5 0 - 5  Troponin I (High Sensitivity)  Result Value Ref Range   Troponin I (High Sensitivity) 7 <18 ng/L      Assessment & Plan:   Problem List Items Addressed This Visit       Genitourinary   UTI (urinary tract infection) - Primary    Acute and improving symptoms at this time.  Recommend they continue Keflex as ordered until complete and then can recheck urine outpatient in upcoming weeks.  If any worsening symptoms, then return to office.        Other   Memory impairment    Being followed at [facility] at this time, which is appropriate, is a [employer].      Obesity    BMI 33.63 with HTN/HLD.  Recommended eating smaller high protein, low fat meals more frequently and exercising 30 mins a day 5 times a week with a goal of 10-15lb weight loss in the next 3 months. Patient voiced their understanding and motivation to adhere to these recommendations.         Follow up plan: Return for in 4 weeks for lab only visit -- check urine.

## 2021-08-10 NOTE — Assessment & Plan Note (Signed)
BMI 33.63 with HTN/HLD.  Recommended eating smaller high protein, low fat meals more frequently and exercising 30 mins a day 5 times a week with a goal of 10-15lb weight loss in the next 3 months. Patient voiced their understanding and motivation to adhere to these recommendations.

## 2021-08-11 LAB — URINE CULTURE: Culture: >=100000 — AB

## 2021-08-15 ENCOUNTER — Ambulatory Visit: Payer: Medicare HMO

## 2021-09-05 ENCOUNTER — Ambulatory Visit: Payer: Medicare HMO

## 2021-09-07 ENCOUNTER — Other Ambulatory Visit: Payer: Medicare PPO

## 2021-09-07 ENCOUNTER — Other Ambulatory Visit: Payer: Self-pay

## 2021-09-07 DIAGNOSIS — N3001 Acute cystitis with hematuria: Secondary | ICD-10-CM | POA: Diagnosis not present

## 2021-09-07 LAB — URINALYSIS, ROUTINE W REFLEX MICROSCOPIC
Bilirubin, UA: NEGATIVE
Glucose, UA: NEGATIVE
Ketones, UA: NEGATIVE
Leukocytes,UA: NEGATIVE
Nitrite, UA: NEGATIVE
Protein,UA: NEGATIVE
Specific Gravity, UA: 1.015 (ref 1.005–1.030)
Urobilinogen, Ur: 0.2 mg/dL (ref 0.2–1.0)
pH, UA: 7 (ref 5.0–7.5)

## 2021-09-07 NOTE — Progress Notes (Signed)
Please let Holly Lewis know her urine is looking improved this check.  Only a trace of red blood cells, but no other concerns for infection.  Great news!! ?

## 2021-10-20 IMAGING — CT CT RENAL STONE PROTOCOL
2 of 4 series · 16 of 46 positions shown, 18 images · non-contrast
Comparison: None.

CLINICAL DATA: Left flank pain

EXAM:
CT ABDOMEN AND PELVIS WITHOUT CONTRAST
TECHNIQUE: Multidetector CT imaging of the abdomen and pelvis was performed
following the standard protocol without IV contrast.

[Series 2: stone full standard · axial · 0.85mm/px · z∈[-1048,-618]mm · 13 of 96 slices shown, 15 images]
[im 5/96  soft-tissue]
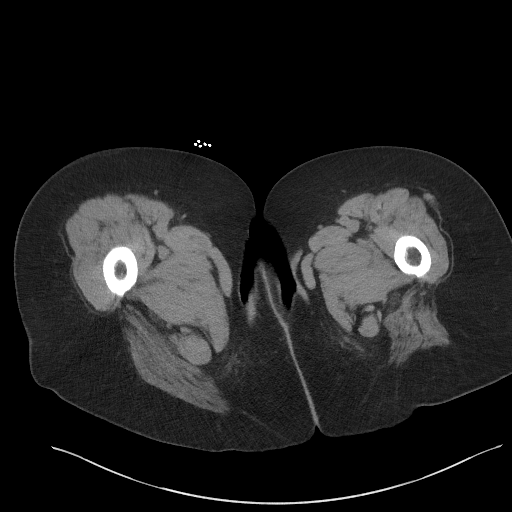
[im 5/96  bone]
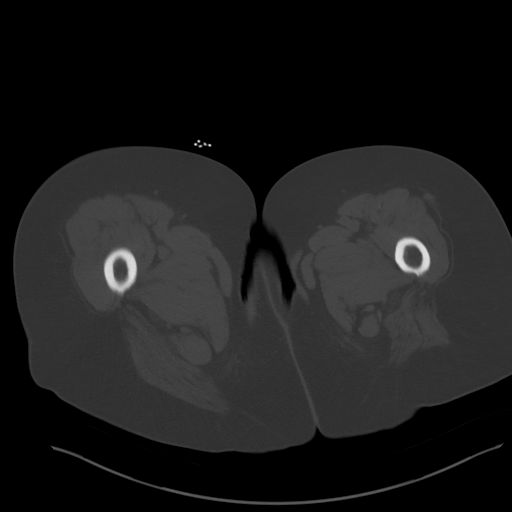
[im 13/96  soft-tissue]
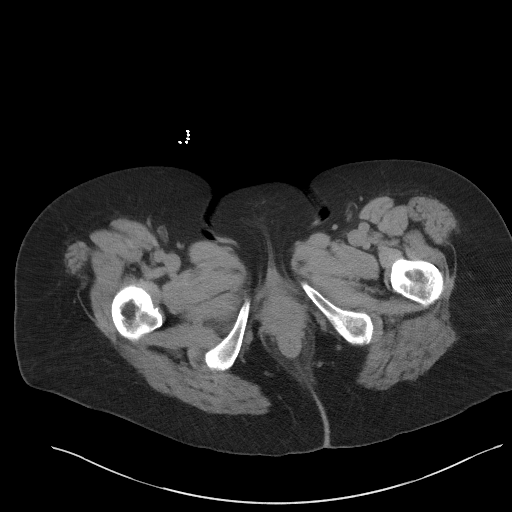
[im 21/96  soft-tissue]
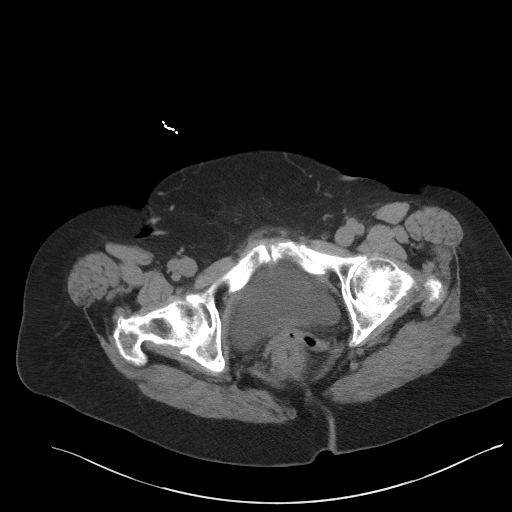
[im 25/96  soft-tissue]
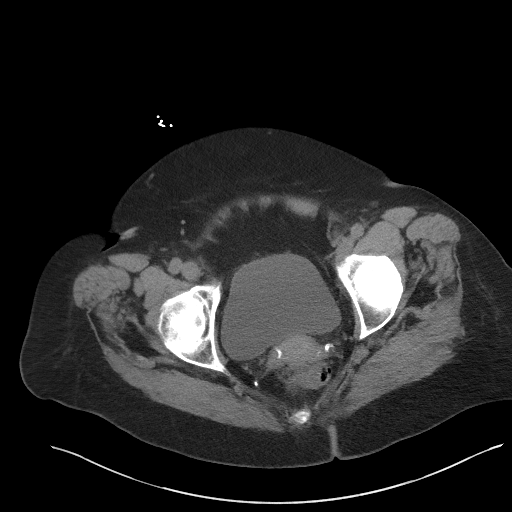
[im 34/96  soft-tissue]
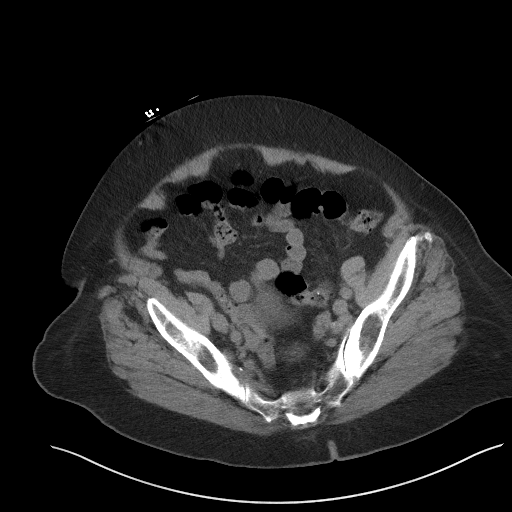
[im 42/96  soft-tissue]
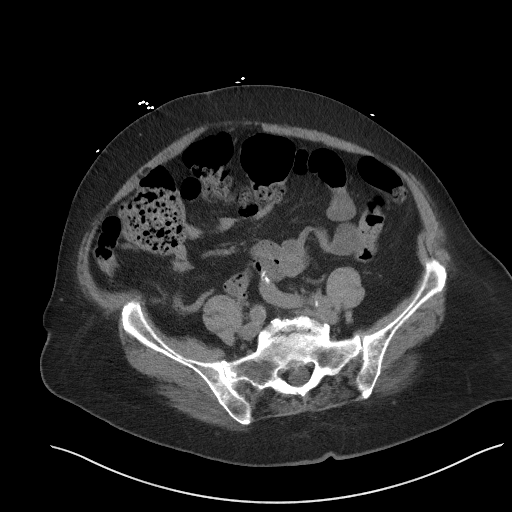
[im 50/96  soft-tissue]
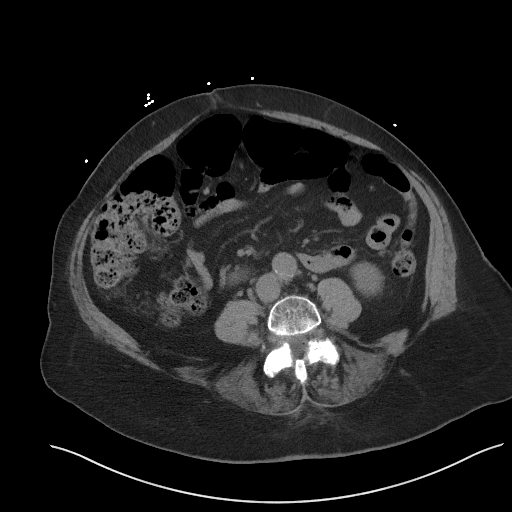
[im 54/96  soft-tissue]
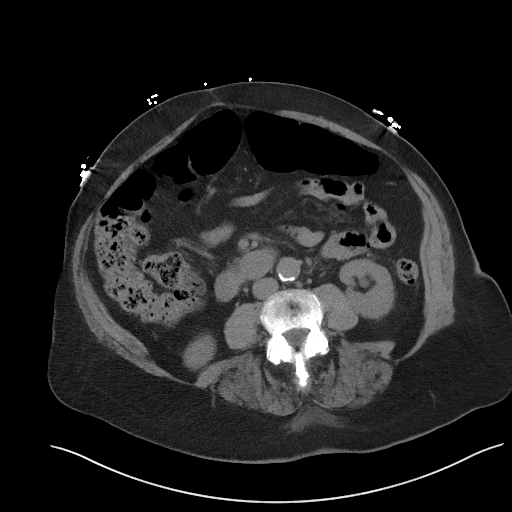
[im 62/96  soft-tissue]
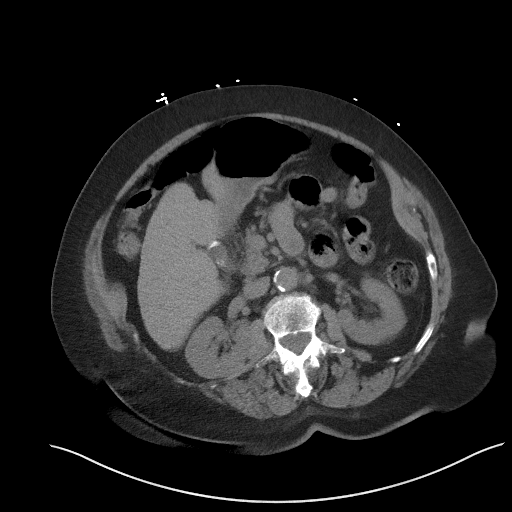
[im 62/96  bone]
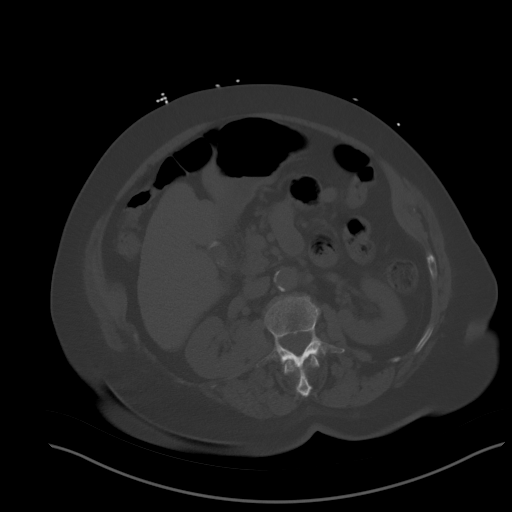
[im 71/96  soft-tissue]
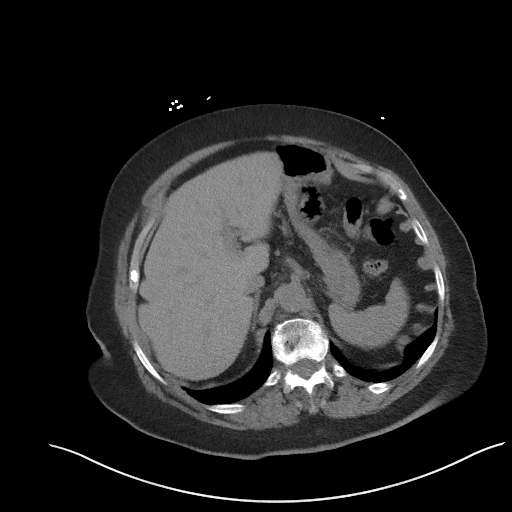
[im 75/96  soft-tissue]
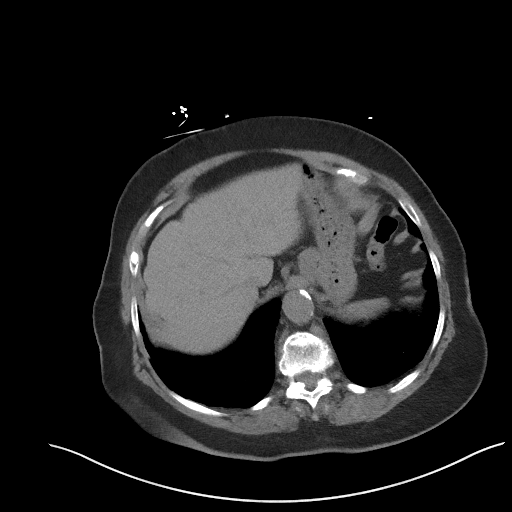
[im 83/96  soft-tissue]
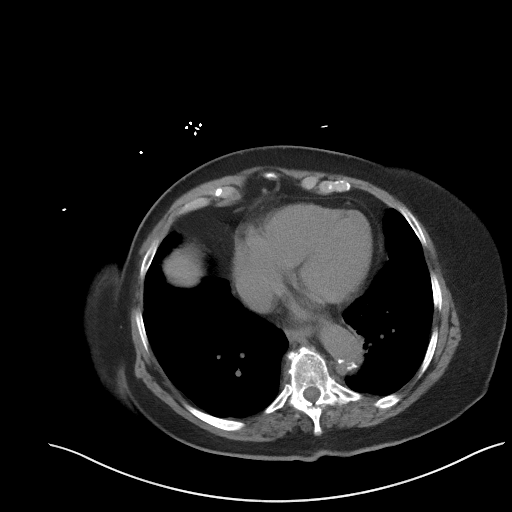
[im 91/96  soft-tissue]
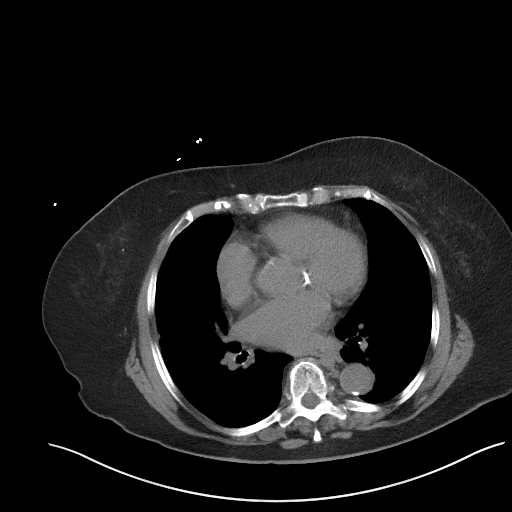

[Series 5: coronal · coronal · 0.79mm/px · 3 of 151 slices shown]
[im 51/151  soft-tissue]
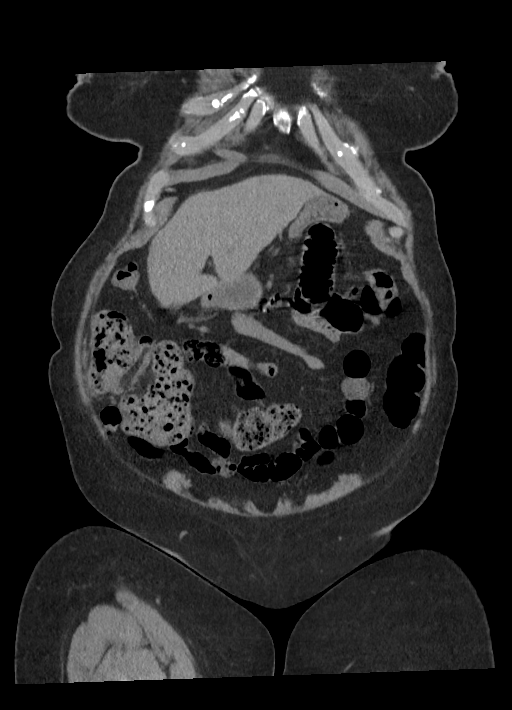
[im 67/151  soft-tissue]
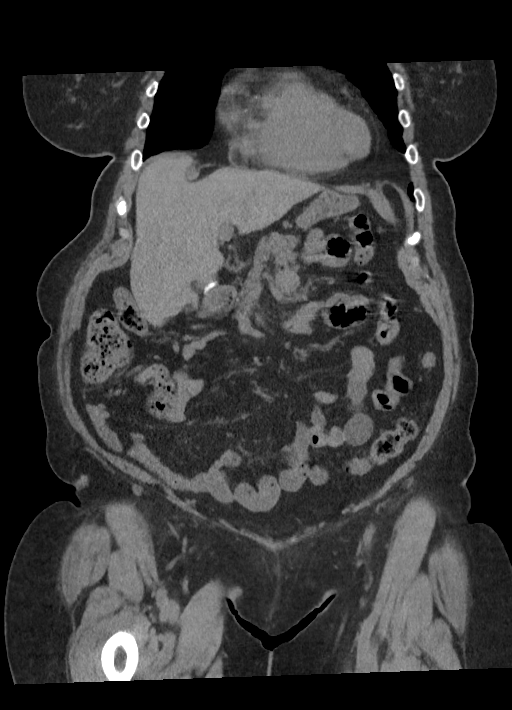
[im 84/151  soft-tissue]
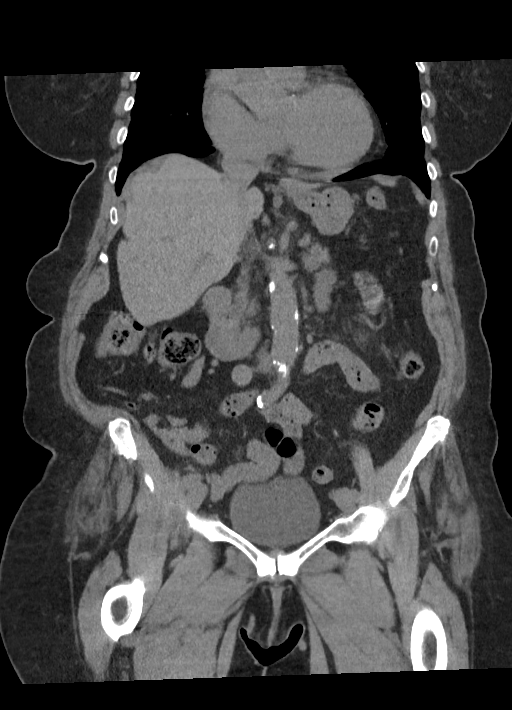

[16 of 46 positions shown; findings below may reference images not displayed]

FINDINGS: Lower chest: No acute abnormality

Hepatobiliary: Gallstones within the gallbladder. There appears to
be gallbladder wall calcifications. No biliary ductal dilatation. No
focal hepatic abnormality.

Pancreas: No focal abnormality or ductal dilatation.

Spleen: No focal abnormality.  Normal size.

Adrenals/Urinary Tract: No adrenal abnormality. No focal renal
abnormality. No stones or hydronephrosis. Urinary bladder is
unremarkable.

Stomach/Bowel: Normal appendix. Stomach, large and small bowel
grossly unremarkable.

Vascular/Lymphatic: Aortic atherosclerosis. No enlarged abdominal or
pelvic lymph nodes.

Reproductive: Uterus and adnexa unremarkable.  No mass.

Other: 2 No free fluid or free air.

Musculoskeletal: No acute bony abnormality.
IMPRESSION: Cholelithiasis.  Gallbladder wall calcifications.

No renal or ureteral stones.  No hydronephrosis.

Aortic atherosclerosis.

No acute findings.

## 2021-11-11 ENCOUNTER — Other Ambulatory Visit: Payer: Self-pay

## 2021-11-11 DIAGNOSIS — Z79899 Other long term (current) drug therapy: Secondary | ICD-10-CM | POA: Insufficient documentation

## 2021-11-11 DIAGNOSIS — Z91040 Latex allergy status: Secondary | ICD-10-CM | POA: Diagnosis not present

## 2021-11-11 DIAGNOSIS — R4182 Altered mental status, unspecified: Secondary | ICD-10-CM | POA: Diagnosis not present

## 2021-11-11 DIAGNOSIS — I129 Hypertensive chronic kidney disease with stage 1 through stage 4 chronic kidney disease, or unspecified chronic kidney disease: Secondary | ICD-10-CM | POA: Insufficient documentation

## 2021-11-11 DIAGNOSIS — T38891A Poisoning by other hormones and synthetic substitutes, accidental (unintentional), initial encounter: Secondary | ICD-10-CM | POA: Diagnosis not present

## 2021-11-11 DIAGNOSIS — R82998 Other abnormal findings in urine: Secondary | ICD-10-CM | POA: Insufficient documentation

## 2021-11-11 DIAGNOSIS — T50901A Poisoning by unspecified drugs, medicaments and biological substances, accidental (unintentional), initial encounter: Secondary | ICD-10-CM | POA: Diagnosis not present

## 2021-11-11 DIAGNOSIS — R5381 Other malaise: Secondary | ICD-10-CM | POA: Insufficient documentation

## 2021-11-11 DIAGNOSIS — N183 Chronic kidney disease, stage 3 unspecified: Secondary | ICD-10-CM | POA: Insufficient documentation

## 2021-11-11 LAB — CBC WITH DIFFERENTIAL/PLATELET
Abs Immature Granulocytes: 0.04 10*3/uL (ref 0.00–0.07)
Basophils Absolute: 0.1 10*3/uL (ref 0.0–0.1)
Basophils Relative: 1 %
Eosinophils Absolute: 0.2 10*3/uL (ref 0.0–0.5)
Eosinophils Relative: 2 %
HCT: 36.3 % (ref 36.0–46.0)
Hemoglobin: 12 g/dL (ref 12.0–15.0)
Immature Granulocytes: 0 %
Lymphocytes Relative: 27 %
Lymphs Abs: 2.4 10*3/uL (ref 0.7–4.0)
MCH: 30 pg (ref 26.0–34.0)
MCHC: 33.1 g/dL (ref 30.0–36.0)
MCV: 90.8 fL (ref 80.0–100.0)
Monocytes Absolute: 0.7 10*3/uL (ref 0.1–1.0)
Monocytes Relative: 7 %
Neutro Abs: 5.5 10*3/uL (ref 1.7–7.7)
Neutrophils Relative %: 63 %
Platelets: 256 10*3/uL (ref 150–400)
RBC: 4 MIL/uL (ref 3.87–5.11)
RDW: 13.1 % (ref 11.5–15.5)
WBC: 8.9 10*3/uL (ref 4.0–10.5)
nRBC: 0 % (ref 0.0–0.2)

## 2021-11-11 LAB — COMPREHENSIVE METABOLIC PANEL
ALT: 18 U/L (ref 0–44)
AST: 30 U/L (ref 15–41)
Albumin: 3.9 g/dL (ref 3.5–5.0)
Alkaline Phosphatase: 65 U/L (ref 38–126)
Anion gap: 10 (ref 5–15)
BUN: 19 mg/dL (ref 8–23)
CO2: 22 mmol/L (ref 22–32)
Calcium: 9.4 mg/dL (ref 8.9–10.3)
Chloride: 105 mmol/L (ref 98–111)
Creatinine, Ser: 0.95 mg/dL (ref 0.44–1.00)
GFR, Estimated: 58 mL/min — ABNORMAL LOW (ref >60)
Glucose, Bld: 111 mg/dL — ABNORMAL HIGH (ref 70–99)
Potassium: 3.4 mmol/L — ABNORMAL LOW (ref 3.5–5.1)
Sodium: 137 mmol/L (ref 135–145)
Total Bilirubin: 0.5 mg/dL (ref 0.3–1.2)
Total Protein: 8.1 g/dL (ref 6.5–8.1)

## 2021-11-11 LAB — TROPONIN I (HIGH SENSITIVITY): Troponin I (High Sensitivity): 8 ng/L (ref <18)

## 2021-11-11 NOTE — ED Triage Notes (Signed)
First nurse note- Pt reports was started on new medications and may have possibly gotten it mixed up and took what she wasn't supposed to. Pt sitting unassisted in wheelchair, A&O x 4, resp even and unlabored, NAD noted.

## 2021-11-11 NOTE — ED Triage Notes (Signed)
Pt states she thinks she took some medications she was not supposed to, husband providing melatonin and steroxin bladder formula stating he believes they are the one's she took on top of her regular medications. Pt states she thinks she only took one tablet of each. Husband also states pt took 5 mg of donepezil as ordered- states this is a new medications and believes it's causing the s/s today. Pt states "I feel like hell." Pt states she's unable to describe. Pt denies N/V/D, CP, SOB, dizziness.

## 2021-11-12 ENCOUNTER — Emergency Department
Admission: EM | Admit: 2021-11-12 | Discharge: 2021-11-12 | Disposition: A | Discharge status: Home / Self Care | Payer: Medicare PPO | Attending: Emergency Medicine | Admitting: Emergency Medicine

## 2021-11-12 DIAGNOSIS — T50901A Poisoning by unspecified drugs, medicaments and biological substances, accidental (unintentional), initial encounter: Secondary | ICD-10-CM

## 2021-11-12 LAB — URINALYSIS, ROUTINE W REFLEX MICROSCOPIC
Bilirubin Urine: NEGATIVE
Glucose, UA: NEGATIVE mg/dL
Hgb urine dipstick: NEGATIVE
Ketones, ur: NEGATIVE mg/dL
Leukocytes,Ua: NEGATIVE
Nitrite: NEGATIVE
Protein, ur: NEGATIVE mg/dL
Specific Gravity, Urine: 1.002 — ABNORMAL LOW (ref 1.005–1.030)
pH: 7 (ref 5.0–8.0)

## 2021-11-12 LAB — ACETAMINOPHEN LEVEL: Acetaminophen (Tylenol), Serum: <10 ug/mL — ABNORMAL LOW (ref 10–30)

## 2021-11-12 LAB — SALICYLATE LEVEL: Salicylate Lvl: <7 mg/dL — ABNORMAL LOW (ref 7.0–30.0)

## 2021-11-12 MED ORDER — SODIUM CHLORIDE 0.9 % IV BOLUS
500.0000 mL | Freq: Once | INTRAVENOUS | Status: AC
  Administered 2021-11-12: 500 mL via INTRAVENOUS

## 2021-11-12 NOTE — ED Provider Notes (Signed)
[facility] Provider Note    Event Date/Time   First MD Initiated Contact with Patient 11/12/21 [phone]     (approximate)   History   Drug Overdose   HPI  Holly Lewis is a 88 y.o. female brought to the ED from home with a chief complaint of accidental overdose.  Son states patient took all of her Friday pills from her pillbox.  Additionally due to memory issues she may have also taken an extra melatonin and sterile oxygen bladder formula which is not prescription.  Son cannot guarantee that patient did not get into Tylenol or salicylates.  There are no cardiac or diabetic medications around for patient to get into.  Initially patient states she "felt like hell".  Now states she is at baseline and feels fine.  Denies headache, chest pain, shortness of breath, abdominal pain, nausea, vomiting or dizziness.  Son states she started 5 mg of Donepezil on Monday and states she has been feeling generalized malaise since starting it and thinks she might be having a side effect from it.  Also wants urine checked as patient had altered mentation last time she had a UTI.  Denies fall/injury/trauma.   Past Medical History   Past Medical History:  Diagnosis Date   Anxiety    Blood transfusion without reported diagnosis    Cataract    Chest pain    a. Pt reports nl stress test in 2016 in [place]; b. 05/2017 declined stress test for recurrent c/p.   Hyperlipidemia    Hypertension    PAF (paroxysmal atrial fibrillation) (HCC)    a. Dx in 2017 in [place] per pt-->s/p TEE/DCCV;  b. CHA2DS2VASc = 4-->Eliquis.     Active Problem List   Patient Active Problem List   Diagnosis Date Noted   UTI (urinary tract infection) 08/10/2021   CKD (chronic kidney disease) stage 3, GFR 30-59 ml/min (HCC) 01/23/2021   Aortic atherosclerosis (HCC) 07/23/2020   Obesity 07/23/2020   Memory impairment 11/28/2019   PAF (paroxysmal atrial fibrillation) (HCC) 02/05/2019   Osteoarthritis 05/28/2018    Advanced care planning/counseling discussion 11/14/2017   DNR (do not resuscitate) 11/14/2017   Atopic dermatitis 01/09/2017   Urge incontinence of urine 01/09/2017   Essential hypertension, benign 12/12/2016   Hypercholesteremia 12/12/2016   Insomnia 12/12/2016     Past Surgical History   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 Authorizing Provider  Apoaequorin 10 MG CAPS Take by mouth.    [provider]  doxepin (SINEQUAN) 10 MG capsule Take 1 capsule (10 mg total) by mouth at bedtime. 01/28/20   Cannady, Corrie Dandy T, NP  Melatonin 10 MG TABS Take 10 mg by mouth at bedtime.    [provider]  meloxicam (MOBIC) 15 MG tablet Take 7.5 mg by mouth daily as needed. 05/21/19   [provider]  Multiple Vitamin (MULTIVITAMIN) tablet Take 1 tablet by mouth daily.    [provider]     Allergies  Diltiazem, Latex, Oxybutynin, and Oxybutynin chloride   Family History   Family History  Problem Relation Age of Onset   Diabetes Mother    Diabetes Sister    Cancer Sister  breast   Alzheimer's disease Sister    Diabetes Brother    Cancer Brother        blood     Physical Exam  Triage Vital Signs: ED Triage Vitals  Enc Vitals Group     BP 11/11/21 2228 (!) 159/84     Pulse Rate 11/11/21 2228 81     Resp 11/11/21 2228 16     Temp 11/11/21 2228 (!) 97.5 F (36.4 C)     Temp Source 11/11/21 2228 Oral     SpO2 11/11/21 2228 94 %     Weight --      Height --      Head Circumference --      Peak Flow --      Pain Score 11/11/21 2237 0     Pain Loc --      Pain Edu? --      Excl. in [facility]? --     Updated Vital Signs: BP (!) 159/84   Pulse 81   Temp (!) 97.5 F (36.4 C) (Oral)   Resp 16   SpO2 94%     General: Awake, no distress.  CV:  RRR. Good peripheral perfusion.  Resp:  Normal effort. CTAB. Abd:  Nontender. No distention.  Other:  Alert and oriented to person and place. CNII-XII grossly intact. 5/5 motor strength and sensation all extremities. MAEx4.   ED Results / Procedures / Treatments  Labs (all labs ordered are listed, but only abnormal results are displayed) Labs Reviewed  URINALYSIS, ROUTINE W REFLEX MICROSCOPIC - Abnormal; Notable for the following components:      Result Value   Color, Urine STRAW (*)    APPearance CLEAR (*)    Specific Gravity, Urine 1.002 (*)    All other components within normal limits  COMPREHENSIVE METABOLIC PANEL - Abnormal; Notable for the following components:   Potassium 3.4 (*)    Glucose, Bld 111 (*)    GFR, Estimated 58 (*)    All other components within normal limits  ACETAMINOPHEN LEVEL - Abnormal; Notable for the following components:   Acetaminophen (Tylenol), Serum <10 (*)    All other components within normal limits  SALICYLATE LEVEL - Abnormal; Notable for the following components:   Salicylate Lvl [id number] (*)    All other components within normal limits  URINE CULTURE  CBC WITH DIFFERENTIAL/PLATELET  TROPONIN I (HIGH SENSITIVITY)     EKG  ED ECG REPORT I, Petrice Beedy J, the attending physician, personally viewed and interpreted this ECG.   Date: 11/12/2021  EKG Time: 2227  Rate: 80  Rhythm: normal sinus rhythm  Axis: Normal  Intervals:none  ST&T Change: Nonspecific    RADIOLOGY None   Official radiology report(s): No results found.   PROCEDURES:  Critical Care performed: No  Procedures   MEDICATIONS ORDERED IN ED: Medications  sodium chloride 0.9 % bolus 500 mL (0 mLs Intravenous Stopped 11/12/21 0511)     IMPRESSION / MDM / ASSESSMENT AND PLAN / ED COURSE  I reviewed the triage vital signs and the nursing notes.                             88 year old female presenting with accidental  overdose.  I have identified this patient may have a potentially life-threatening condition. Differential diagnosis includes, but is not limited to, alcohol, illicit or prescription medications, or other toxic ingestion; intracranial pathology such as stroke or intracerebral hemorrhage; fever  or infectious causes including sepsis; hypoxemia and/or hypercarbia; uremia; trauma; endocrine related disorders such as diabetes, hypoglycemia, and thyroid-related diseases; hypertensive encephalopathy; etc.   I have personally reviewed patient's records and see that she had a PCP office visit at the [place] on 10/31/2021  Laboratory results demonstrate normal WBC, normal electrolytes, initial EKG and troponin unremarkable.  We will add acetaminophen and salicylate levels.  Awaiting urine specimen.  Clinical Course as of 11/12/21 0520  Sat Nov 12, 2021  0519 UA unremarkable.  Patient eager for discharge home.  Strict return precautions given.  Patient and son verbalized understanding agree with plan of care. [JS]    Clinical Course User Index [JS] Paulette Blanch, MD     FINAL CLINICAL IMPRESSION(S) / ED DIAGNOSES   Final diagnoses:  Accidental overdose, initial encounter     Rx / DC Orders   ED Discharge Orders     None        Note:  This document was prepared using Dragon voice recognition software and may include unintentional dictation errors.   Paulette Blanch, MD 11/12/21 [phone]

## 2021-11-12 NOTE — Discharge Instructions (Signed)
You may resume all medications as usual today.  Urine culture has been added.  You will be notified of any positive results.  Return to the ER for worsening symptoms, persistent vomiting, difficulty breathing or other concerns.

## 2021-11-14 LAB — URINE CULTURE: Culture: 10000 — AB

## 2021-11-15 ENCOUNTER — Telehealth: Payer: Self-pay | Admitting: *Deleted

## 2021-11-15 NOTE — Telephone Encounter (Signed)
Transition Care Management Unsuccessful Follow-up Telephone Call  Date of discharge and from where:  [facility]  Attempts:  1st Attempt  Reason for unsuccessful TCM follow-up call:  Unable to leave message  voicemail not set up

## 2021-11-16 NOTE — Telephone Encounter (Signed)
Transition Care Management Unsuccessful Follow-up Telephone Call  Date of discharge and from where:  [facility]  Attempts:  2nd Attempt  Reason for unsuccessful TCM follow-up call:  Unable to reach patient

## 2022-01-04 ENCOUNTER — Encounter: Payer: Self-pay | Admitting: Podiatry

## 2022-01-04 ENCOUNTER — Ambulatory Visit: Payer: Medicare PPO

## 2022-01-04 ENCOUNTER — Ambulatory Visit (INDEPENDENT_AMBULATORY_CARE_PROVIDER_SITE_OTHER): Payer: No Typology Code available for payment source | Admitting: Podiatry

## 2022-01-04 DIAGNOSIS — M2011 Hallux valgus (acquired), right foot: Secondary | ICD-10-CM

## 2022-01-04 DIAGNOSIS — M2042 Other hammer toe(s) (acquired), left foot: Secondary | ICD-10-CM | POA: Diagnosis not present

## 2022-01-04 DIAGNOSIS — M2012 Hallux valgus (acquired), left foot: Secondary | ICD-10-CM | POA: Diagnosis not present

## 2022-01-04 DIAGNOSIS — M2041 Other hammer toe(s) (acquired), right foot: Secondary | ICD-10-CM

## 2022-01-04 DIAGNOSIS — M859 Disorder of bone density and structure, unspecified: Secondary | ICD-10-CM | POA: Insufficient documentation

## 2022-01-04 DIAGNOSIS — G301 Alzheimer's disease with late onset: Secondary | ICD-10-CM | POA: Insufficient documentation

## 2022-01-05 NOTE — Progress Notes (Signed)
  Subjective:  Patient ID: Holly Lewis, female    DOB: [birth year],  MRN: [id number] HPI Chief Complaint  Patient presents with   Foot Pain    Bilateral (R>L) - bunion and hammertoe deformity x years, starting to affect her gait, shoes sometimes uncomfortable, developing corn on 2nd toe right   New Patient (Initial Visit)    88 y.o. female presents with the above complaint.   ROS: Denies fever chills nausea vomiting muscle aches pains calf pain back pain chest pain shortness of breath.  Past Medical History:  Diagnosis Date   Anxiety    Blood transfusion without reported diagnosis    Cataract    Chest pain    a. Pt reports nl stress test in 2016 in [place]; b. 05/2017 declined stress test for recurrent c/p.   Hyperlipidemia    Hypertension    PAF (paroxysmal atrial fibrillation) (HCC)    a. Dx in 2017 in [place] per pt-->s/p TEE/DCCV;  b. CHA2DS2VASc = 4-->Eliquis.   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   kidney stones      Current Outpatient Medications:    Apoaequorin 10 MG CAPS, Take by mouth., Disp: , Rfl:    doxepin (SINEQUAN) 10 MG capsule, Take 1 capsule (10 mg total) by mouth at bedtime., Disp: 90 capsule, Rfl: 3   Melatonin 10 MG TABS, Take 10 mg by mouth at bedtime., Disp: , Rfl:    meloxicam (MOBIC) 15 MG tablet, Take 7.5 mg by mouth daily as needed., Disp: , Rfl:    Multiple Vitamin (MULTIVITAMIN) tablet, Take 1 tablet by mouth daily., Disp: , Rfl:   Allergies  Allergen Reactions   Diltiazem Rash   Latex Rash   Oxybutynin Rash   Oxybutynin Chloride Rash   Review of Systems Objective:  There were no vitals filed for this visit.  General: Well developed, nourished, in no acute distress, alert and oriented x3   Dermatological: Skin is warm, dry and supple bilateral. Nails x 10 are well maintained; remaining integument appears unremarkable at this time. There  are no open sores, no preulcerative lesions, no rash or signs of infection present.  Vascular: Dorsalis Pedis artery and Posterior Tibial artery pedal pulses are 2/4 bilateral with immedate capillary fill time. Pedal hair growth present. No varicosities and no lower extremity edema present bilateral.   Neruologic: Grossly intact via light touch bilateral. Vibratory intact via tuning fork bilateral. Protective threshold with Semmes Wienstein monofilament intact to all pedal sites bilateral. Patellar and Achilles deep tendon reflexes 2+ bilateral. No Babinski or clonus noted bilateral.   Musculoskeletal: No gross boney pedal deformities bilateral. No pain, crepitus, or limitation noted with foot and ankle range of motion bilateral. Muscular strength 5/5 in all groups tested bilateral.  Hallux valgus deformities bilateral with cocked up hammertoe not medially displaced but arthritic at the PIPJ and painful on range of motion at that joint.  Gait: Unassisted, Nonantalgic.    Radiographs:  None taken  Assessment & Plan:   Assessment: Hammertoe deformity with hallux valgus bilateral osteoarthritis PIPJ second digit bilateral  Plan: Discussed appropriate shoe gear discussed surgery discussed topical anti-inflammatories discussed padding.  We will follow-up with [place] on an as-needed basis.     Holly Lewis T. [place], [place]

## 2022-01-25 ENCOUNTER — Telehealth: Payer: Self-pay | Admitting: Cardiovascular Disease

## 2022-01-25 NOTE — Telephone Encounter (Signed)
[facility]   Deleted recall

## 2023-04-19 DIAGNOSIS — Z111 Encounter for screening for respiratory tuberculosis: Secondary | ICD-10-CM | POA: Diagnosis not present

## 2023-04-19 DIAGNOSIS — Z0279 Encounter for issue of other medical certificate: Secondary | ICD-10-CM | POA: Diagnosis not present

## 2023-05-22 DIAGNOSIS — R32 Unspecified urinary incontinence: Secondary | ICD-10-CM | POA: Diagnosis not present

## 2023-05-22 DIAGNOSIS — F0150 Vascular dementia without behavioral disturbance: Secondary | ICD-10-CM | POA: Diagnosis not present

## 2023-05-22 DIAGNOSIS — I69815 Cognitive social or emotional deficit following other cerebrovascular disease: Secondary | ICD-10-CM | POA: Diagnosis not present

## 2023-05-22 DIAGNOSIS — R2681 Unsteadiness on feet: Secondary | ICD-10-CM | POA: Diagnosis not present

## 2023-05-29 DIAGNOSIS — E785 Hyperlipidemia, unspecified: Secondary | ICD-10-CM | POA: Diagnosis not present

## 2023-05-29 DIAGNOSIS — I10 Essential (primary) hypertension: Secondary | ICD-10-CM | POA: Diagnosis not present

## 2023-05-29 DIAGNOSIS — Z1329 Encounter for screening for other suspected endocrine disorder: Secondary | ICD-10-CM | POA: Diagnosis not present

## 2023-05-29 DIAGNOSIS — Z131 Encounter for screening for diabetes mellitus: Secondary | ICD-10-CM | POA: Diagnosis not present

## 2023-06-07 DIAGNOSIS — I129 Hypertensive chronic kidney disease with stage 1 through stage 4 chronic kidney disease, or unspecified chronic kidney disease: Secondary | ICD-10-CM | POA: Diagnosis not present

## 2023-06-07 DIAGNOSIS — B3731 Acute candidiasis of vulva and vagina: Secondary | ICD-10-CM | POA: Diagnosis not present

## 2023-06-07 DIAGNOSIS — N1831 Chronic kidney disease, stage 3a: Secondary | ICD-10-CM | POA: Diagnosis not present

## 2023-06-07 DIAGNOSIS — F0150 Vascular dementia without behavioral disturbance: Secondary | ICD-10-CM | POA: Diagnosis not present

## 2023-06-07 DIAGNOSIS — I69815 Cognitive social or emotional deficit following other cerebrovascular disease: Secondary | ICD-10-CM | POA: Diagnosis not present

## 2023-06-07 DIAGNOSIS — F0280 Dementia in other diseases classified elsewhere without behavioral disturbance: Secondary | ICD-10-CM | POA: Diagnosis not present

## 2023-06-11 DIAGNOSIS — E782 Mixed hyperlipidemia: Secondary | ICD-10-CM | POA: Diagnosis not present

## 2023-06-11 DIAGNOSIS — Z8673 Personal history of transient ischemic attack (TIA), and cerebral infarction without residual deficits: Secondary | ICD-10-CM | POA: Diagnosis not present

## 2023-06-11 DIAGNOSIS — F01B0 Vascular dementia, moderate, without behavioral disturbance, psychotic disturbance, mood disturbance, and anxiety: Secondary | ICD-10-CM | POA: Diagnosis not present

## 2023-06-11 DIAGNOSIS — I129 Hypertensive chronic kidney disease with stage 1 through stage 4 chronic kidney disease, or unspecified chronic kidney disease: Secondary | ICD-10-CM | POA: Diagnosis not present

## 2023-06-11 DIAGNOSIS — L292 Pruritus vulvae: Secondary | ICD-10-CM | POA: Diagnosis not present

## 2023-06-11 DIAGNOSIS — N1831 Chronic kidney disease, stage 3a: Secondary | ICD-10-CM | POA: Diagnosis not present

## 2023-06-18 DIAGNOSIS — N1831 Chronic kidney disease, stage 3a: Secondary | ICD-10-CM | POA: Diagnosis not present

## 2023-06-18 DIAGNOSIS — I129 Hypertensive chronic kidney disease with stage 1 through stage 4 chronic kidney disease, or unspecified chronic kidney disease: Secondary | ICD-10-CM | POA: Diagnosis not present
